# Patient Record
Sex: Female | Born: 1935 | Race: Black or African American | Hispanic: No | Smoking: Former smoker
Health system: Southern US, Community
[De-identification: ages and names within clinical notes are randomized; demographics above are authoritative.]

## PROBLEM LIST (undated history)

## (undated) DIAGNOSIS — I10 Essential (primary) hypertension: Secondary | ICD-10-CM

## (undated) DIAGNOSIS — R319 Hematuria, unspecified: Secondary | ICD-10-CM

## (undated) DIAGNOSIS — R011 Cardiac murmur, unspecified: Secondary | ICD-10-CM

## (undated) DIAGNOSIS — M1990 Unspecified osteoarthritis, unspecified site: Secondary | ICD-10-CM

## (undated) DIAGNOSIS — R300 Dysuria: Secondary | ICD-10-CM

## (undated) DIAGNOSIS — G309 Alzheimer's disease, unspecified: Secondary | ICD-10-CM

## (undated) DIAGNOSIS — I639 Cerebral infarction, unspecified: Secondary | ICD-10-CM

## (undated) DIAGNOSIS — E559 Vitamin D deficiency, unspecified: Secondary | ICD-10-CM

## (undated) DIAGNOSIS — M2550 Pain in unspecified joint: Secondary | ICD-10-CM

## (undated) DIAGNOSIS — F0280 Dementia in other diseases classified elsewhere without behavioral disturbance: Secondary | ICD-10-CM

## (undated) DIAGNOSIS — C801 Malignant (primary) neoplasm, unspecified: Secondary | ICD-10-CM

## (undated) DIAGNOSIS — N390 Urinary tract infection, site not specified: Secondary | ICD-10-CM

## (undated) DIAGNOSIS — N182 Chronic kidney disease, stage 2 (mild): Secondary | ICD-10-CM

## (undated) HISTORY — DX: Dementia in other diseases classified elsewhere without behavioral disturbance: F02.80

## (undated) HISTORY — DX: Chronic kidney disease, stage 2 (mild): N18.2

## (undated) HISTORY — DX: Alzheimer's disease, unspecified: G30.9

## (undated) HISTORY — DX: Vitamin D deficiency, unspecified: E55.9

## (undated) HISTORY — DX: Essential (primary) hypertension: I10

## (undated) HISTORY — DX: Pain in unspecified joint: M25.50

## (undated) HISTORY — DX: Hematuria, unspecified: R31.9

## (undated) HISTORY — DX: Dysuria: R30.0

## (undated) HISTORY — DX: Unspecified osteoarthritis, unspecified site: M19.90

## (undated) HISTORY — DX: Urinary tract infection, site not specified: N39.0

---

## 1998-02-07 ENCOUNTER — Other Ambulatory Visit: Admission: RE | Admit: 1998-02-07 | Discharge: 1998-02-07 | Payer: Self-pay | Admitting: Internal Medicine

## 2000-01-19 ENCOUNTER — Ambulatory Visit (HOSPITAL_COMMUNITY): Admission: RE | Admit: 2000-01-19 | Discharge: 2000-01-19 | Payer: Self-pay | Admitting: Gastroenterology

## 2000-04-05 ENCOUNTER — Encounter: Admission: RE | Admit: 2000-04-05 | Discharge: 2000-04-05 | Payer: Self-pay | Admitting: Internal Medicine

## 2000-04-05 ENCOUNTER — Encounter: Payer: Self-pay | Admitting: Internal Medicine

## 2003-08-27 ENCOUNTER — Ambulatory Visit (HOSPITAL_COMMUNITY): Admission: RE | Admit: 2003-08-27 | Discharge: 2003-08-27 | Payer: Self-pay | Admitting: Cardiology

## 2006-02-21 ENCOUNTER — Emergency Department (HOSPITAL_COMMUNITY): Admission: EM | Admit: 2006-02-21 | Discharge: 2006-02-21 | Payer: Self-pay | Admitting: Emergency Medicine

## 2006-06-03 ENCOUNTER — Encounter: Payer: Self-pay | Admitting: Vascular Surgery

## 2006-06-03 ENCOUNTER — Ambulatory Visit (HOSPITAL_COMMUNITY): Admission: RE | Admit: 2006-06-03 | Discharge: 2006-06-03 | Payer: Self-pay | Admitting: Internal Medicine

## 2010-11-23 ENCOUNTER — Encounter: Payer: Self-pay | Admitting: Cardiology

## 2011-02-17 ENCOUNTER — Encounter: Payer: Self-pay | Admitting: Internal Medicine

## 2011-03-19 NOTE — Op Note (Signed)
[place]  Patient:    [name], [name]                   MRN: [id number] Proc. Date: 01/19/00 Adm. Date:  [id number] Attending:  Charna Sierah CC:         Lind Guest. August Saucer, M.D.                           Operative Report  DATE OF BIRTH:  February 09, 1921.  REFERRING PHYSICIAN:  Eric L. August Saucer, M.D.  PROCEDURE PERFORMED:  Colonoscopy.  ENDOSCOPIST:  Anselmo Rod, M.D.  INSTRUMENT USED:  Olympus video colonoscope.  INDICATION FOR PROCEDURE:  Rectal bleeding in a 77 year old black female.  Rule out masses, polyps, hemorrhoids, etc.  PREPROCEDURE PREPARATION:  Informed consent was procured from the patient. After risks and benefits were discussed with the patient, the patient was fasted for eight hours prior to the procedure and prepped with a bottle of magnesium citrate and a gallon of NuLytely the night prior to the procedure.  PREPROCEDURE PHYSICAL:  VITAL SIGNS:  The patient had stable vital signs.  NECK:  Supple.  CHEST:  Clear to auscultation.  S1, S2 regular.  ABDOMEN:  Soft, with normal abdominal bowel sounds.  DESCRIPTION OF PROCEDURE:  The patient was placed in the left lateral decubitus  position and sedated with 55 mg of Demerol and 5 mg of Versed intravenously. Once the patient was adequately sedate and maintained on low-flow oxygen and continuous cardiac monitoring, the Olympus video colonoscope was advanced from the rectum o the cecum without difficulty.  There were a few left-sided diverticular pockets, a few internal and small external hemorrhoids, no other abnormality was seen. The patient tolerated the procedure well without complication.  IMPRESSION: 1. Small internal and external hemorrhoids. 2. Few left-sided diverticula. 3. No masses or polyps seen.  RECOMMENDATIONS: 1. The patient has been advised to increase her fluid and fiber in her diet and to    avoid constipation. 2. Nonsteroidal ______ as an  outpatient. 3. Followup as advised in the next two weeks.  I suspect the rectal bleeding is from her hemorrhoids.  Further recommendations  will be made to her on her next visit. DD:  01/19/00 TD:  01/19/00 Job: [id number]

## 2011-03-19 NOTE — Cardiovascular Report (Signed)
NAME:  [name], [name]                    ACCOUNT NO.:  [id number]   MEDICAL RECORD NO.:  [id number]                   PATIENT TYPE:  OIB   LOCATION:  2855                                 FACILITY:  [facility]   PHYSICIAN:  Mohan N. Sharyn Lull, M.D.              DATE OF BIRTH:  02/09/1921   DATE OF PROCEDURE:  08/27/2003  DATE OF DISCHARGE:                              CARDIAC CATHETERIZATION   PROCEDURE:  1. Left cardiac catheterization.  2. Selective left and right coronary angiography.  3. Left ventriculography.  4. Aortography via left groin using Judkins technique.   INDICATIONS FOR PROCEDURE:  [name] is an 77 year old black female with  a past medical history significant for hypertension, degenerative joint  disease, complaints of retrosternal chest pressure grade 4/10 radiating to  the neck and right arm off and on when under stress relieved with rest.  The  patient gives history of exertional chest pressure associated with shortness  of breath when climbing stairs associated with diaphoresis.  Denies any  nausea, vomiting.  Denies PND, orthopnea, leg swelling.  Denies palpitation,  lightheadedness, or syncope.  Denies cough, fever, chills.  Denies relation  of chest pain to breathing food.  Also complains of occasional crampy  claudication pain in the calves.  Denies any weakness, slurred speech, or  blurring of vision.   PAST MEDICAL HISTORY:  As above.   PAST SURGICAL HISTORY:  1. She had right hand surgery in the past.  2. Left knee surgery in the past.   MEDICATIONS:  1. At home she is on Nitro-Dur 0.2 mg q.1h. daily.  2. Nitrostat sublingual 0.4 mg p.r.n.  3. Toprol XL 50 mg p.o. daily.  4. Norvasc 5 mg p.o. daily.  5. Enteric-coated aspirin 325 mg p.o. daily.  6. Multivitamin one tablet daily.  7. Os-Cal one tablet daily.  8. ___________ 8 mg p.o. daily.   SOCIAL HISTORY:  She is widowed, retired.  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  No  history of smoking or alcohol abuse.   FAMILY HISTORY:  Father died of cerebral hemorrhage at the age of 68.  Mother died of MI at the age of 23.  One sister had MI at the age of 86.  One sister had COPD and heart problems.   PHYSICAL EXAMINATION:  GENERAL:  She is alert, awake, oriented x3, in no  acute distress.  VITAL SIGNS:  Blood pressure 130/80, pulse 66, regular.  HEENT:  Conjunctivae pink.  NECK:  Supple.  No JVD.  No bruit.  LUNGS:  Clear to auscultation without rhonchi or rales.  CARDIOVASCULAR:  S1, S2 was normal.  There was no S3 gallop.  ABDOMEN:  Soft.  Bowel sounds were present.  Nontender.  EXTREMITIES:  No clubbing, cyanosis, edema.   IMPRESSION:  1. New onset angina.  2. Hypertension.  3. Degenerative joint disease.  4. Claudication pain.  5. Family history  of coronary artery disease.   PLAN:  Discussed with patient regarding left catheterization, possible PTCA  and stenting, its risks, i.e., death, MI, stroke, need for emergency CABG,  risk of restenosis, local vascular complications, etc. and consented for  PCI.   PROCEDURE:  After obtaining informed consent patient was brought to the  catheterization laboratory and was placed on fluoroscopy table.  Right groin  was prepped and draped in usual fashion.  2% Xylocaine was used for local  anesthesia in right groin.  With the help of thin wall needle attempted to  access the right femoral artery without success and then 6-French arterial  sheath was placed in the left femoral artery without problem.  Next, 6-  French left Judkins catheter was advanced over the wire under fluoroscopic  guidance up to the ascending aorta where it was pulled out.  The catheter  was aspirated and connected to the manifold.  Catheter was further advanced  and engaged into left coronary ostium.  Multiple views of the left system  were taken.  Next, the catheter was disengaged and was pulled out over the  wire and was replaced with  6-French right Judkins catheter which was  advanced over the wire under fluoroscopic guidance up to the ascending  aorta.  Right JR4, JR3.5, and no-torque catheter could not be engaged into  right coronary ostium.  Then, AL1 6-French diagnostic catheter was advanced  over the wire under fluoroscopic guidance up to the ascending aorta where it  was pulled out.  The catheter was aspirated and connected to the manifold.  Catheter was further advanced and engaged into the right coronary ostium.  Multiple views of the right system were taken.  Next, the catheter was  disengaged and was pulled out over the wire and was replaced with 6-French  pigtail catheter which was advanced over the wire under fluoroscopic  guidance up to the ascending aorta where it was pulled out.  The catheter  was aspirated and connected to the manifold.  Catheter was further advanced  across the aortic valve into the LV.  LV pressures were recorded.  Next, LV  graphy was done in 30 degree RAO position.  Post angiographic pressures were  recorded from LV and then pullback pressures were recorded from aorta.  There was no significant gradient across the aortic valve.  Next, the  pigtail catheter was pulled down into the abdominal aorta.  Aortography was  done in PA position.  Next, the pigtail catheter was pulled out over the  wire.  Sheaths were aspirated and flushed.   FINDINGS:  LV showed good LV systolic function, EF of 55-60%.  There was  moderate LVH.  There was 2+ MR.  Left main was patent.  LAD was patent.  Diagonal 1 was very small which was patent.  Diagonal 2 was medium sized  which was patent.  Ramus has 20-30% proximal stenosis.  Left circumflex was  patent.  OM 1 to OM 3 were very, very small.  RCA was patent.  Aortography  showed no abdominal aortic aneurysm.  Bilateral renal arteries  were patent.  Bilateral common iliac arteries were patent.  Right external iliac and right SFA proximally was patent.  The  patient tolerated procedure  well.  There were no complications.  The patient was transferred to recovery  room in stable condition.  Eduardo Osier. Sharyn Lull, M.D.    MNH/MEDQ  D:  08/27/2003  T:  08/27/2003  Job:  [id number]   cc:   Minerva Areola L. August Saucer, M.D.  [address] [address]  [place]  [place] [address]  Fax: [phone]   Cath Lab

## 2011-03-22 ENCOUNTER — Other Ambulatory Visit: Payer: Self-pay | Admitting: Internal Medicine

## 2011-03-22 ENCOUNTER — Ambulatory Visit
Admission: RE | Admit: 2011-03-22 | Discharge: 2011-03-22 | Disposition: A | Discharge status: Home / Self Care | Payer: Medicare Other | Source: Ambulatory Visit | Attending: Internal Medicine | Admitting: Internal Medicine

## 2011-03-22 DIAGNOSIS — R05 Cough: Secondary | ICD-10-CM

## 2011-04-01 ENCOUNTER — Encounter (INDEPENDENT_AMBULATORY_CARE_PROVIDER_SITE_OTHER): Payer: Medicare Other

## 2011-04-01 DIAGNOSIS — M79609 Pain in unspecified limb: Secondary | ICD-10-CM

## 2011-04-05 NOTE — Procedures (Unsigned)
DUPLEX DEEP VENOUS EXAM - LOWER EXTREMITY  INDICATION:  Edema and pain.  HISTORY:  Edema:  Yes. Trauma/Surgery:  No. Pain:  Yes. PE:  No. Previous DVT:  No. Anticoagulants:  Aspirin 81 mg. Other:  DUPLEX EXAM:               CFV   SFV   PopV  PTV    GSV               R  L  R  L  R  L  R   L  R  L Thrombosis    o  o     o     o      o     o Spontaneous   +  +     +     +      +     + Phasic        +  +     +     +      +     + Augmentation  +  +     +     +      +     + Compressible  +  +     +     +      +     + Competent     +  +     +     +      +     +  Legend:  + - yes  o - no  p - partial  D - decreased  IMPRESSION:  No evidence of acute deep venous thrombosis or superficial thrombophlebitis within the left lower extremity.  No evidence of venous insufficiency visualized.   _____________________________ Quita Skye Hart [place], M.D.  OD/MEDQ  D:  04/01/2011  T:  04/01/2011  Job:  [id number]

## 2011-04-20 ENCOUNTER — Inpatient Hospital Stay (HOSPITAL_COMMUNITY)
Admission: EM | Admit: 2011-04-20 | Discharge: 2011-04-22 | DRG: 312 | Disposition: A | Discharge status: Home / Self Care | Payer: Medicare Other | Attending: Internal Medicine | Admitting: Internal Medicine

## 2011-04-20 ENCOUNTER — Emergency Department (HOSPITAL_COMMUNITY): Payer: Medicare Other

## 2011-04-20 DIAGNOSIS — E860 Dehydration: Secondary | ICD-10-CM | POA: Diagnosis present

## 2011-04-20 DIAGNOSIS — N179 Acute kidney failure, unspecified: Secondary | ICD-10-CM | POA: Diagnosis present

## 2011-04-20 DIAGNOSIS — F0390 Unspecified dementia without behavioral disturbance: Secondary | ICD-10-CM | POA: Diagnosis present

## 2011-04-20 DIAGNOSIS — I2510 Atherosclerotic heart disease of native coronary artery without angina pectoris: Secondary | ICD-10-CM | POA: Diagnosis present

## 2011-04-20 DIAGNOSIS — R55 Syncope and collapse: Principal | ICD-10-CM | POA: Diagnosis present

## 2011-04-20 DIAGNOSIS — I10 Essential (primary) hypertension: Secondary | ICD-10-CM | POA: Diagnosis present

## 2011-04-20 DIAGNOSIS — E876 Hypokalemia: Secondary | ICD-10-CM | POA: Diagnosis present

## 2011-04-20 LAB — CK TOTAL AND CKMB (NOT AT ARMC)
CK, MB: 2.8 ng/mL (ref 0.3–4.0)
CK, MB: 4.7 ng/mL — ABNORMAL HIGH (ref 0.3–4.0)
Relative Index: INVALID (ref 0.0–2.5)
Total CK: 65 U/L (ref 7–177)
Total CK: 97 U/L (ref 7–177)

## 2011-04-20 LAB — DIFFERENTIAL
Basophils Absolute: 0 10*3/uL (ref 0.0–0.1)
Eosinophils Absolute: 0 10*3/uL (ref 0.0–0.7)
Lymphs Abs: 1.7 10*3/uL (ref 0.7–4.0)
Neutrophils Relative %: 53 % (ref 43–77)

## 2011-04-20 LAB — URINALYSIS, ROUTINE W REFLEX MICROSCOPIC
Glucose, UA: NEGATIVE mg/dL
Hgb urine dipstick: NEGATIVE
Leukocytes, UA: NEGATIVE
Specific Gravity, Urine: 1.017 (ref 1.005–1.030)

## 2011-04-20 LAB — COMPREHENSIVE METABOLIC PANEL
AST: 27 U/L (ref 0–37)
Albumin: 3.7 g/dL (ref 3.5–5.2)
Calcium: 11.5 mg/dL — ABNORMAL HIGH (ref 8.4–10.5)
Chloride: 102 mEq/L (ref 96–112)
Creatinine, Ser: 1.4 mg/dL — ABNORMAL HIGH (ref 0.50–1.10)
Total Protein: 7.1 g/dL (ref 6.0–8.3)

## 2011-04-20 LAB — CBC
MCV: 78.3 fL (ref 78.0–100.0)
Platelets: 209 10*3/uL (ref 150–400)
RBC: 5.17 MIL/uL — ABNORMAL HIGH (ref 3.87–5.11)
WBC: 4.5 10*3/uL (ref 4.0–10.5)

## 2011-04-20 LAB — TROPONIN I
Troponin I: <0.3 ng/mL (ref <0.30)
Troponin I: <0.3 ng/mL (ref <0.30)

## 2011-04-21 DIAGNOSIS — R55 Syncope and collapse: Secondary | ICD-10-CM

## 2011-04-21 LAB — CBC
Hemoglobin: 13.6 g/dL (ref 12.0–15.0)
MCH: 26.8 pg (ref 26.0–34.0)
MCHC: 33.9 g/dL (ref 30.0–36.0)
MCV: 78.9 fL (ref 78.0–100.0)
RBC: 5.08 MIL/uL (ref 3.87–5.11)

## 2011-04-21 LAB — BASIC METABOLIC PANEL
BUN: 25 mg/dL — ABNORMAL HIGH (ref 6–23)
CO2: 25 mEq/L (ref 19–32)
CO2: 28 mEq/L (ref 19–32)
Calcium: 10.6 mg/dL — ABNORMAL HIGH (ref 8.4–10.5)
Calcium: 11 mg/dL — ABNORMAL HIGH (ref 8.4–10.5)
Chloride: 104 mEq/L (ref 96–112)
Creatinine, Ser: 1.22 mg/dL — ABNORMAL HIGH (ref 0.50–1.10)
Creatinine, Ser: 1.26 mg/dL — ABNORMAL HIGH (ref 0.50–1.10)
GFR calc Af Amer: 48 mL/min — ABNORMAL LOW (ref >60)
GFR calc non Af Amer: 42 mL/min — ABNORMAL LOW (ref >60)
GFR calc non Af Amer: 40 mL/min — ABNORMAL LOW (ref >60)
Glucose, Bld: 92 mg/dL (ref 70–99)
Glucose, Bld: 92 mg/dL (ref 70–99)
Potassium: 3.7 mEq/L (ref 3.5–5.1)
Sodium: 140 mEq/L (ref 135–145)
Sodium: 141 mEq/L (ref 135–145)

## 2011-04-21 LAB — CARDIAC PANEL(CRET KIN+CKTOT+MB+TROPI)
CK, MB: 4.4 ng/mL — ABNORMAL HIGH (ref 0.3–4.0)
CK, MB: 4.5 ng/mL — ABNORMAL HIGH (ref 0.3–4.0)
Relative Index: INVALID (ref 0.0–2.5)
Relative Index: 4.5 — ABNORMAL HIGH (ref 0.0–2.5)
Total CK: 95 U/L (ref 7–177)
Total CK: 100 U/L (ref 7–177)
Troponin I: <0.3 ng/mL (ref <0.30)
Troponin I: <0.3 ng/mL (ref <0.30)

## 2011-04-21 LAB — LIPID PANEL
Cholesterol: 171 mg/dL (ref 0–200)
HDL: 82 mg/dL (ref >39)
LDL Cholesterol: 81 mg/dL (ref 0–99)
Total CHOL/HDL Ratio: 2.1 RATIO
Triglycerides: 42 mg/dL (ref <150)
VLDL: 8 mg/dL (ref 0–40)

## 2011-04-21 LAB — URINE CULTURE
Colony Count: 90000
Culture  Setup Time: 201206192334

## 2011-04-21 LAB — HEMOGLOBIN A1C: Hgb A1c MFr Bld: 5.7 % — ABNORMAL HIGH (ref <5.7)

## 2011-04-22 LAB — COMPREHENSIVE METABOLIC PANEL
ALT: 16 U/L (ref 0–35)
Albumin: 3 g/dL — ABNORMAL LOW (ref 3.5–5.2)
BUN: 18 mg/dL (ref 6–23)
Calcium: 10 mg/dL (ref 8.4–10.5)
GFR calc Af Amer: >60 mL/min (ref >60)
Glucose, Bld: 87 mg/dL (ref 70–99)
Sodium: 140 mEq/L (ref 135–145)
Total Protein: 6 g/dL (ref 6.0–8.3)

## 2011-04-22 LAB — CBC
Hemoglobin: 12.2 g/dL (ref 12.0–15.0)
MCH: 26.8 pg (ref 26.0–34.0)
MCHC: 34.3 g/dL (ref 30.0–36.0)
RDW: 13.9 % (ref 11.5–15.5)

## 2011-04-23 LAB — PROTEIN ELECTROPH W RFLX QUANT IMMUNOGLOBULINS
Alpha-1-Globulin: 4.2 % (ref 2.9–4.9)
Beta 2: 6.3 % (ref 3.2–6.5)

## 2011-04-23 LAB — VITAMIN D 1,25 DIHYDROXY
Vitamin D 1, 25 (OH)2 Total: 48 pg/mL (ref 18–72)
Vitamin D2 1, 25 (OH)2: <8 pg/mL

## 2011-04-23 NOTE — Discharge Summary (Signed)
[name], [name] NO.:  [id number]  MEDICAL RECORD NO.:  [id number]  LOCATION:  2029                         FACILITY:  [facility]  PHYSICIAN:  Thad Ranger, MD       DATE OF BIRTH:  02/07/1921  DATE OF ADMISSION:  04/20/2011 DATE OF DISCHARGE:                        DISCHARGE SUMMARY - REFERRING   PRIMARY CARE PHYSICIAN:  Eric L. August Saucer, M.D.  DISCHARGE DIAGNOSES: 1. Syncope, likely vasovagal versus dehydration. 2. Acute kidney injury with a creatinine of 1.4, likely due to     hypovolemia. 3. Hypercalcemia, likely secondary to calcium supplementation with     hydrochlorothiazide and hypovolemia, improved. 4. Hypertension. 5. Dementia. 6. Generalized debility. 7. Dehydration.  DISCHARGE MEDICATIONS: 1. Amlodipine 5 mg p.o. daily. 2. Donepezil 5 mg p.o. daily. 3. Meloxicam 7.5 mg p.o. daily. 4. Multivitamin 1 tablet p.o. daily.  The patient was counseled to stop taking the following medications which includes triamterene/hydrochlorothiazide.  BRIEF HISTORY OF PRESENT ILLNESS AT THE TIME OF ADMISSION:  [name] is an 77 year old female with past medical history of coronary artery disease and hypertension, who presented to the emergency room with syncopal episode.  The patient's son at the time of admission noted that the patient was not responding in the afternoon of admission and thereafter she had a fall.  The patient lost consciousness for about 2-3 minutes.  EMS was called.  History was taken from the patient, the patient's son, and the records.  The patient was apparently working in the yard before the fall.  She had been complaining of diarrhea for the past few days, but no nausea, vomiting, abdominal pain, or shortness of breath.  The patient had complained of chest pain, substernal, nonradiating, which lasted for a few minutes.  RADIOLOGICAL DATA:  Chest x-ray, two-view on June 19; no active disease. No significant change.  Chest CT head  without contrast on June 19; atrophia with chronic microvascular ischemia.  No acute abnormality.  2- D echo in June 20 showed EF of 65-70%, normal wall motion, no recent wall motion abnormalities, grade 1 diastolic dysfunction.  Carotid Doppler on June 20; preliminary findings showed no ICA stenosis bilaterally.  PERTINENT LABORATORY AND DIAGNOSTIC DATA:  CBC at the time of admission; white count 4.5, hemoglobin 13.9, hematocrit 40.5, platelets 209, troponin less than 0.3.  UA negative for any UTI.  HbA1c 5.7.  TSH 1.15. Calcium was elevated at 11.5 with ionized calcium at 1.43.  Vitamin D level was within normal range.  Lipid profile; cholesterol 171, LDL 81.  BRIEF HOSPITALIZATION COURSE:  [name] is an 77 year old female who was admitted with a syncopal episode, likely vasovagal with dehydration, diarrhea, and acute kidney injury. 1. Syncope, likely vasovagal.  The patient was apparently working in     the yard on the day of admission, and the temperature outside was     90 degrees.  The patient also had been complaining of diarrhea     prior to the admission.  She was aggressively hydrated with IV     fluids.  Creatinine was noted to be 1.4.  Triamterene and     hydrochlorothiazide was discontinued from the patient's profile.     Calcium was also  elevated at 11.5.  Creatinine function has     improved to 0.9 at the time of discharge.  2-D echo and carotid     Dopplers were done which were essentially unremarkable.  Details as     dictated above.  CT head was done which was negative for any acute     stroke. 2. Acute kidney injury, prerenal, likely secondary to hypokalemia,     dehydration, diarrhea, and being on triamterene and     hydrochlorothiazide.  She was gently hydrated with IV fluids.  At     the time of discharge, creatinine is 0.9. 3. Hypercalcemia, most likely due to calcium supplementation with     hydrochlorothiazide with hypovolemia.  The patient was gently      hydrated with IV fluids.  She did not require any pamidronate or     bisglycinate.  Calcium is 10 at the time of discharge.  The     patient's son also reported that she was drinking milk of magnesia     at home quite frequently.  The patient and her son was counseled     against the overuse of milk of magnesium. 4. Hypertension.  Blood pressure remained stable during the     hospitalization.  The patient was started on low dose of     amlodipine, triamterene, and hydrochlorothiazide but discontinued     from her profile.  PHYSICAL EXAMINATION AT THE TIME OF DISCHARGE:  VITAL SIGNS: Temperature 98.0, pulse 67, respirations 18, blood pressure 120/59, O2 sats 100% on room air. GENERAL:  The patient is alert, awake, and oriented, currently at baseline mental status. CVS:  S1 and S2 clear. CHEST:  Clear to auscultation bilaterally. ABDOMEN:  Soft, nontender, nondistended.  Normal bowel sounds. EXTREMITIES:  No cyanosis, clubbing or edema noted in upper or lower extremities bilaterally.  The patient is ambulating in the room without any assistance.  DISPOSITION:  Physical therapy was also consulted for evaluation. Patient has all the assistive devices at home and per PT/OT, she currently appears to be at baseline and needs 24x7 supervision at home. I discussed in detail with the patient's son, Jillyn Hidden, who she lives with at home and the family will be arranging home health aide for 24x7 supervision at home.  DISCHARGE FOLLOWUP:  With Dr. Willey Blade within next 7-10 days.  Discharge time 35 minutes.     Thad Ranger, MD    RR/MEDQ  D:  04/22/2011  T:  04/22/2011  Job:  [id number]  cc:   Minerva Areola L. August Saucer, M.D.  Electronically Signed by Andres Labrum RAI  on 04/23/2011 05:29:38 PM

## 2011-04-27 LAB — CULTURE, BLOOD (ROUTINE X 2): Culture: NO GROWTH

## 2011-05-01 NOTE — H&P (Signed)
NAMEJOYCELIN, RADLOFF NO.:  [id number]  MEDICAL RECORD NO.:  [id number]  LOCATION:  2029                         FACILITY:  [facility]  PHYSICIAN:  Celso Amy, MD   DATE OF BIRTH:  02/07/1921  DATE OF ADMISSION:  04/20/2011 DATE OF DISCHARGE:                             HISTORY & PHYSICAL   CHIEF COMPLAINT:  Passing out.  HISTORY OF PRESENT ILLNESS:  The patient is an 77 year old African American female with a past medical history of coronary artery disease who presented to ER with chief complaint of syncope.  History of present illness dates back to this afternoon when the patient's son noticed that the patient was not responding and thereafter she had a fall.  The patient lost consciousness for 2-3 minutes, 9-1-1 was called and after the fall, the patient was distended for 1-2 minutes and later she started recovering.  At the time of H and P, the patient is at her baseline.  History has been taken from the patient's, patient's son and records.  The patient was working in the yard before the fall.  The patient was complaining of diarrhea from past few days.  No complaint of nausea or vomiting.  No complaint of abdominal pain.  No complaint of shortness of breath.  The patient complained of chest pain substernal non radiating which lasted for few minutes.  The patient is not able to describe any exacerbating or relieving factors.  No complaint of slurred speech or any focal weakness.  No complaint of fever, cough or chills. No complaint of any overt bleeding.  No complaint of recent travel.  ALLERGIES:  The patient has no known drug allergies.  FAMILY HISTORY:  The patient says she cannot remember how her parents passed away.  When asked from the son, the patient says her mother passed away from stroke and he does not remember how her father passed away.  SOCIAL HISTORY:  The patient lives with her son.  The patient is a nonsmoker and nondrinker.  PAST  MEDICAL HISTORY:  Positive for dementia, coronary artery disease, hypertension, osteoporosis and osteoarthritis.  REVIEW OF SYSTEMS:  Negative.  MEDICATIONS AS OUTPATIENT: 1. The patient is on donepezil 5 mg p.o. daily. 2. Calcium, magnesium and zinc tablet 1 tablet p.o. daily. 3. Multivitamin 1 tablet p.o. daily. 4. Vitamin B12 500 mg p.o. daily. 5. Meloxicam 7.5 mg p.o. daily. 6. Triamterene/hydrochlorothiazide 27.5/25 mg p.o. daily.  PHYSICAL EXAMINATION:  VITALS:  Blood pressure is 124/51, pulse 76, respiratory rate is 16, temperature afebrile, pulse ox the patient is 100%. GENERAL:  The patient is awake, alert and oriented to place and person. He is well built. HEENT:  Pupils are equally reactive to light and accommodation. Extraocular movement is intact.  Head is atraumatic, normocephalic. NECK:  Supple. RESPIRATORY:  No acute respiratory distress. CHEST:  Clear to auscultation bilaterally.  No rhonchi or rales were heard. CARDIOVASCULAR:  S1 and S2.  Regular in rate and rhythm.  No murmur is appreciated. GI:  Deep bowel sounds are present. ABDOMEN:  Soft, nontender and nondistended. EXTREMITIES:  No lower extremity or cyanosis was seen. CNS:  Cranial nerves II through XII were grossly intact.  No focal motor deficit  was seen. PSYCH:  The patient is in normal mood and affect.  LABORATORY DATA:  Sodium 139, potassium 4.2, serum chloride 102, bicarb 23, BUN 29, serum creatinine 1.4, glucose 73.  The patient's hemoglobin is 13.9, platelets 209, troponin's less than 0.30, CK 65, CK-MB 2.8. EKG shows normal sinus rhythm, calcium is 11.5.  UA shows ketones positive.  Leukocyte esterase and nitrates are negative.  The patient's CT head shows atrophy and chronic microvascular ischemic disease.  No acute abnormality.  Chest x-ray shows no acute disease.  IMPRESSION: 1. Syncope.  The patient is an 77 year old African American female     with dementia, was working in yard and  today's temperature was 90     degrees and the patient is presenting with passing out.  This     syncope could be cardiac versus volume related versus vasovagal. 2. Renal acute kidney injury.  The patient's creatinine 1.4.  This is     most likely because of prerenal and is most likely because of     hypovolemia. 3. Fluid electrolyte nutrition.  The patient is normocalcemic and     normonatremic.  The patient is hypercalcemic most likely because of     calcium supplements plus hydrochlorothiazide plus hypovolemia. 4. Cardiovascular system.  The patient is hemodynamically stable.     Blood pressure is at goal and there is no need of any pressor. 5. Deep venous thrombosis.  We will keep the patient on DVT     prophylaxis. 6. Central nervous system.  Dementia, stable.  PLAN: 1. We will admit the patient to tele. 2. We will cycle troponin's. 3. We will start the patient on IV fluids. 4. We will hold calcium supplements. 5. We will hold her antihypertensive medication. 6. We will follow the patient's BMET to see the trend of calcium.  In     case, the calcium is not back to baseline, then will need PTH     intact, PTHRP, SPEP, UPEP, vitamin D 25, vitamin D 125. 7. If the creatinine is not back to baseline, we will need Nephrology. 8. We will need to get the patient's records from her primary care. 9. The patient's further clinical course depends how the patient does     with treatment plan.    Celso Amy, MD    MB/MEDQ  D:  04/20/2011  T:  04/21/2011  Job:  [id number]  Electronically Signed by Celso Amy M.D. on 05/01/2011 11:35:54 AM

## 2011-06-08 ENCOUNTER — Other Ambulatory Visit: Payer: Self-pay | Admitting: Internal Medicine

## 2011-06-08 DIAGNOSIS — R519 Headache, unspecified: Secondary | ICD-10-CM

## 2011-06-09 ENCOUNTER — Other Ambulatory Visit: Payer: Medicare Other

## 2011-06-11 ENCOUNTER — Ambulatory Visit
Admission: RE | Admit: 2011-06-11 | Discharge: 2011-06-11 | Disposition: A | Discharge status: Home / Self Care | Payer: Medicare Other | Source: Ambulatory Visit | Attending: Internal Medicine | Admitting: Internal Medicine

## 2011-08-09 ENCOUNTER — Emergency Department (HOSPITAL_COMMUNITY)
Admission: EM | Admit: 2011-08-09 | Discharge: 2011-08-09 | Disposition: A | Discharge status: Home / Self Care | Payer: Medicare Other | Attending: Emergency Medicine | Admitting: Emergency Medicine

## 2011-08-09 DIAGNOSIS — I10 Essential (primary) hypertension: Secondary | ICD-10-CM | POA: Insufficient documentation

## 2011-08-09 DIAGNOSIS — N390 Urinary tract infection, site not specified: Secondary | ICD-10-CM | POA: Insufficient documentation

## 2011-08-09 DIAGNOSIS — R000 Tachycardia, unspecified: Secondary | ICD-10-CM | POA: Insufficient documentation

## 2011-08-09 DIAGNOSIS — R5381 Other malaise: Secondary | ICD-10-CM | POA: Insufficient documentation

## 2011-08-09 DIAGNOSIS — G309 Alzheimer's disease, unspecified: Secondary | ICD-10-CM | POA: Insufficient documentation

## 2011-08-09 DIAGNOSIS — F0280 Dementia in other diseases classified elsewhere without behavioral disturbance: Secondary | ICD-10-CM | POA: Insufficient documentation

## 2011-08-09 LAB — URINALYSIS, ROUTINE W REFLEX MICROSCOPIC
Hgb urine dipstick: NEGATIVE
Protein, ur: NEGATIVE mg/dL
Urobilinogen, UA: 1 mg/dL (ref 0.0–1.0)

## 2011-08-09 LAB — DIFFERENTIAL
Basophils Absolute: 0 10*3/uL (ref 0.0–0.1)
Eosinophils Absolute: 0 10*3/uL (ref 0.0–0.7)
Eosinophils Relative: 0 % (ref 0–5)
Lymphocytes Relative: 7 % — ABNORMAL LOW (ref 12–46)
Monocytes Absolute: 0.8 10*3/uL (ref 0.1–1.0)

## 2011-08-09 LAB — CBC
HCT: 43.7 % (ref 36.0–46.0)
MCHC: 34.8 g/dL (ref 30.0–36.0)
MCV: 76.7 fL — ABNORMAL LOW (ref 78.0–100.0)
Platelets: 403 10*3/uL — ABNORMAL HIGH (ref 150–400)
RDW: 14.5 % (ref 11.5–15.5)

## 2011-08-13 ENCOUNTER — Emergency Department (HOSPITAL_COMMUNITY): Payer: Medicare Other

## 2011-08-13 ENCOUNTER — Inpatient Hospital Stay (HOSPITAL_COMMUNITY)
Admission: EM | Admit: 2011-08-13 | Discharge: 2011-08-20 | DRG: 690 | Disposition: A | Discharge status: Home / Self Care | Payer: Medicare Other | Attending: Internal Medicine | Admitting: Internal Medicine

## 2011-08-13 DIAGNOSIS — K5641 Fecal impaction: Secondary | ICD-10-CM | POA: Diagnosis present

## 2011-08-13 DIAGNOSIS — F068 Other specified mental disorders due to known physiological condition: Secondary | ICD-10-CM | POA: Diagnosis present

## 2011-08-13 DIAGNOSIS — M1990 Unspecified osteoarthritis, unspecified site: Secondary | ICD-10-CM | POA: Diagnosis present

## 2011-08-13 DIAGNOSIS — I10 Essential (primary) hypertension: Secondary | ICD-10-CM | POA: Diagnosis present

## 2011-08-13 DIAGNOSIS — R109 Unspecified abdominal pain: Secondary | ICD-10-CM | POA: Diagnosis present

## 2011-08-13 DIAGNOSIS — E871 Hypo-osmolality and hyponatremia: Secondary | ICD-10-CM | POA: Diagnosis present

## 2011-08-13 DIAGNOSIS — N179 Acute kidney failure, unspecified: Secondary | ICD-10-CM | POA: Diagnosis present

## 2011-08-13 DIAGNOSIS — E876 Hypokalemia: Secondary | ICD-10-CM | POA: Diagnosis present

## 2011-08-13 DIAGNOSIS — N390 Urinary tract infection, site not specified: Principal | ICD-10-CM | POA: Diagnosis present

## 2011-08-13 LAB — CBC
HCT: 43.6 % (ref 36.0–46.0)
Hemoglobin: 15.1 g/dL — ABNORMAL HIGH (ref 12.0–15.0)
RBC: 5.7 MIL/uL — ABNORMAL HIGH (ref 3.87–5.11)
RDW: 14.6 % (ref 11.5–15.5)
WBC: 12.3 10*3/uL — ABNORMAL HIGH (ref 4.0–10.5)

## 2011-08-13 LAB — DIFFERENTIAL
Basophils Absolute: 0 10*3/uL (ref 0.0–0.1)
Lymphocytes Relative: 10 % — ABNORMAL LOW (ref 12–46)
Neutro Abs: 10 10*3/uL — ABNORMAL HIGH (ref 1.7–7.7)
Neutrophils Relative %: 81 % — ABNORMAL HIGH (ref 43–77)

## 2011-08-13 LAB — URINALYSIS, ROUTINE W REFLEX MICROSCOPIC
Nitrite: NEGATIVE
Specific Gravity, Urine: 1.025 (ref 1.005–1.030)
pH: 5.5 (ref 5.0–8.0)

## 2011-08-13 LAB — COMPREHENSIVE METABOLIC PANEL
Albumin: 2.3 g/dL — ABNORMAL LOW (ref 3.5–5.2)
Alkaline Phosphatase: 141 U/L — ABNORMAL HIGH (ref 39–117)
BUN: 48 mg/dL — ABNORMAL HIGH (ref 6–23)
Potassium: 4 mEq/L (ref 3.5–5.1)
Sodium: 134 mEq/L — ABNORMAL LOW (ref 135–145)
Total Protein: 8.1 g/dL (ref 6.0–8.3)

## 2011-08-13 LAB — POCT I-STAT TROPONIN I: Troponin i, poc: 0.02 ng/mL (ref 0.00–0.08)

## 2011-08-13 LAB — URINE MICROSCOPIC-ADD ON

## 2011-08-13 LAB — CK TOTAL AND CKMB (NOT AT ARMC)
CK, MB: 1.3 ng/mL (ref 0.3–4.0)
Total CK: 117 U/L (ref 7–177)

## 2011-08-13 LAB — PROCALCITONIN: Procalcitonin: 1.06 ng/mL

## 2011-08-13 MED ORDER — IOHEXOL 300 MG/ML  SOLN
75.0000 mL | Freq: Once | INTRAMUSCULAR | Status: AC | PRN
  Administered 2011-08-13: 75 mL via INTRAVENOUS

## 2011-08-14 ENCOUNTER — Inpatient Hospital Stay (HOSPITAL_COMMUNITY): Payer: Medicare Other

## 2011-08-14 LAB — COMPREHENSIVE METABOLIC PANEL
AST: 34 U/L (ref 0–37)
Albumin: 1.9 g/dL — ABNORMAL LOW (ref 3.5–5.2)
Calcium: 11.1 mg/dL — ABNORMAL HIGH (ref 8.4–10.5)
Chloride: 96 mEq/L (ref 96–112)
Creatinine, Ser: 0.92 mg/dL (ref 0.50–1.10)
Total Protein: 6.3 g/dL (ref 6.0–8.3)

## 2011-08-14 LAB — TYPE AND SCREEN: ABO/RH(D): B POS

## 2011-08-14 LAB — DIFFERENTIAL
Lymphocytes Relative: 13 % (ref 12–46)
Monocytes Absolute: 0.7 10*3/uL (ref 0.1–1.0)
Monocytes Relative: 7 % (ref 3–12)
Neutro Abs: 7.4 10*3/uL (ref 1.7–7.7)

## 2011-08-14 LAB — LIPASE, BLOOD: Lipase: 12 U/L (ref 11–59)

## 2011-08-14 LAB — CBC
Hemoglobin: 15.2 g/dL — ABNORMAL HIGH (ref 12.0–15.0)
MCV: 76.6 fL — ABNORMAL LOW (ref 78.0–100.0)
Platelets: 309 10*3/uL (ref 150–400)
RBC: 5.86 MIL/uL — ABNORMAL HIGH (ref 3.87–5.11)
WBC: 9.3 10*3/uL (ref 4.0–10.5)

## 2011-08-14 LAB — ABO/RH: ABO/RH(D): B POS

## 2011-08-14 NOTE — H&P (Signed)
NAME:  [name], NAY NO.:  [id number]  MEDICAL RECORD NO.:  [id number]  LOCATION:  WLED                         FACILITY:  [facility]  PHYSICIAN:  Hillery Aldo, M.D.   DATE OF BIRTH:  1922/07/29  DATE OF ADMISSION:  08/13/2011 DATE OF DISCHARGE:                             HISTORY & PHYSICAL   PRIMARY CARE PHYSICIAN:  Eric L. August Saucer, M.D.  CHIEF COMPLAINT:  Weakness, diarrhea.  HISTORY OF PRESENT ILLNESS:  The patient is an 77 year old female with past medical history of dementia, who was brought in by her family today with a chief complaint of weakness and diarrhea.  She apparently was seen in the emergency department on August 09, 2011, and diagnosed with a urinary tract infection, and sent home on antibiotics.  Her family brought her to her PCP today and he advised them to bring her to the emergency department.  No family is currently present and I am unable to reach any of them by telephone to obtain any additional history.  The patient is unable to provide me with any details of her presenting complaints and states she actually feels fine.  She does have advanced dementia and is unable to tell me if she has been having loose stools or how she feels in general.  PAST MEDICAL HISTORY: 1. Syncope. 2. Hypertension. 3. Dementia. 4. Degenerative joint disease. 5. Status post cardiac catheterization in October 2004 showing an     ejection fraction of 55% to 60% and minimal coronary disease. 6. Hemorrhoids and diverticulosis noted on colonoscopy performed in     2001 by Dr. Charna Courtny.  FAMILY HISTORY:  Gleaned from her records.  Her father apparently died at age 72 from a cerebral hemorrhage.  The patient's mother died of an MI at age 40.  She also has a sister who had an MI at age 74 and another sister with COPD and heart disease.  SOCIAL HISTORY:  Again, gleaned from her old records as she cannot provide any history to me.  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 There is no history of tobacco, alcohol, or drug use.  ALLERGIES:  No known drug allergies.  CURRENT MEDICATIONS: 1. Multivitamin 1 tablet p.o. daily. 2. Meloxicam 7.5 mg p.o. daily. 3. Donepezil 5 mg p.o. daily. 4. Amlodipine 5 mg p.o. daily.  REVIEW OF SYSTEMS:  Unable to obtain in this demented patient who cannot verbalize to me.  PHYSICAL EXAMINATION:  VITAL SIGNS:  Temperature 97.6, pulse 99, respirations 19, blood pressure 124/72, O2 saturation 96% on room air. GENERAL:  Frail elderly African American female, in no acute distress. HEENT:  Normocephalic, atraumatic.  PERRL.  EOMI.  Oropharynx is clear. Mucous membranes are slightly dry. NECK:  Supple, no thyromegaly, no lymphadenopathy, no jugular venous distention. CHEST:  Diminished breath sounds at the bases.  No rhonchi or rales. HEART:  Mildly tachycardic with a grade 2/6 systolic ejection murmur at the left upper sternal border. ABDOMEN:  Soft, nontender, nondistended with normoactive bowel sounds. EXTREMITIES:  No clubbing, edema, or cyanosis. SKIN:  Warm and dry.  No rashes. NEUROLOGIC:  The patient is disoriented and minimally verbal.  She is not cooperative with neurological exam.  DATA REVIEW:  Chest x-ray shows no active cardiopulmonary disease.  CT scan of the abdomen and pelvis shows fecal impaction of the rectum with no evidence of bowel obstruction or perforation.  Mild biliary and pancreatic ductal dilatation with no evidence of pancreatic mass or surrounding inflammation.  Multiple renal cysts.  12-lead EKG shows sinus tachycardia with no T-wave abnormalities.  LABORATORY DATA:  Urinalysis yields cloudy urine, negative for nitrites with microscopy showing 7-10 white blood cells and many bacteria.  CK is 117, CK-MB 1.3.  Procalcitonin is 1.06.  Sodium is 134, potassium 4.0, chloride 95, bicarb 26, BUN 48, creatinine 1.34, glucose 148, total bilirubin 0.3, alkaline phosphatase 141, AST  49, ALT 29, total protein 8.1, albumin 2.3, calcium 12.  Venous lactic acid is 2.3.  White blood cell count is 12.3, hemoglobin 15.1, hematocrit 43.6, platelets 361.  ASSESSMENT AND PLAN: 1. Urinary tract infection:  The patient has been given a dose of     Rocephin in the emergency department.  We will check urine cultures     and continue Rocephin empirically until culture data is back. 2. Acute renal failure:  The patient's baseline creatinine back in     June is 0.99.  We will hydrate her and monitor her renal function     closely.  We will hold her meloxicam. 3. Hyponatremia:  Likely due to dehydration.  We will rehydrate and     monitor her electrolytes closely. 4. Fecal impaction:  Likely exacerbated by hypercalcemia.  We will     have the nursing staff disimpact her and put her on a very     aggressive bowel regimen. 5. Hypercalcemia:  Unclear etiology, but she has had this in the past.     Serum protein electrophoresis was performed back in June as well as     vitamin D studies and PTH studies.  These were all unrevealing and     ultimately her hypercalcemia was felt to be secondary to     dehydration in the setting of treatment with hydrochlorothiazide.     Apparently, her calcium normalized with hydration.  At this point,     we would start her on nasal calcitonin and consider reevaluating     her if she does not normalize quickly. 6. Dementia:  The patient does have advanced dementia and we will     continue her donepezil. 7. History of hypertension:  Continue the patient's amlodipine with     parameters to hold if her systolic blood     pressure is less than 105. 8. Prophylaxis:  Initiate PAS hoses for deep venous thrombosis     prophylaxis.  Time spent on admission including face-to-face time equals approximately 1 hour.     Hillery Aldo, M.D.     CR/MEDQ  D:  08/13/2011  T:  08/14/2011  Job:  [id number]  cc:   Minerva Areola L. August Saucer, M.D. [address]  [address] [place] [address]  Electronically Signed by Hillery Aldo M.D. on 08/14/2011 12:29:06 PM

## 2011-08-16 LAB — DIFFERENTIAL
Basophils Absolute: 0 10*3/uL (ref 0.0–0.1)
Lymphocytes Relative: 16 % (ref 12–46)
Neutro Abs: 5.8 10*3/uL (ref 1.7–7.7)
Neutrophils Relative %: 73 % (ref 43–77)

## 2011-08-16 LAB — PTH, INTACT AND CALCIUM: Calcium, Total (PTH): 9.7 mg/dL (ref 8.4–10.5)

## 2011-08-16 LAB — CBC
HCT: 48.1 % — ABNORMAL HIGH (ref 36.0–46.0)
Hemoglobin: 16.1 g/dL — ABNORMAL HIGH (ref 12.0–15.0)
RBC: 6.25 MIL/uL — ABNORMAL HIGH (ref 3.87–5.11)
RDW: 15.1 % (ref 11.5–15.5)
WBC: 7.9 10*3/uL (ref 4.0–10.5)

## 2011-08-16 LAB — COMPREHENSIVE METABOLIC PANEL
CO2: 25 mEq/L (ref 19–32)
Calcium: 10.4 mg/dL (ref 8.4–10.5)
Creatinine, Ser: 0.64 mg/dL (ref 0.50–1.10)
GFR calc Af Amer: >90 mL/min (ref >90)
GFR calc non Af Amer: 78 mL/min — ABNORMAL LOW (ref >90)
Glucose, Bld: 69 mg/dL — ABNORMAL LOW (ref 70–99)

## 2011-08-17 ENCOUNTER — Inpatient Hospital Stay (HOSPITAL_COMMUNITY): Payer: Medicare Other

## 2011-08-17 LAB — CBC
HCT: 38.1 % (ref 36.0–46.0)
RDW: 15.1 % (ref 11.5–15.5)
WBC: 6.7 10*3/uL (ref 4.0–10.5)

## 2011-08-17 LAB — BASIC METABOLIC PANEL
BUN: 16 mg/dL (ref 6–23)
Chloride: 108 mEq/L (ref 96–112)
GFR calc Af Amer: >90 mL/min (ref >90)
Potassium: 4.4 mEq/L (ref 3.5–5.1)

## 2011-08-17 LAB — C1 ESTERASE INHIBITOR, FUNCTIONAL: C1INH Functional/C1INH Total MFr SerPl: >100 % (ref >=68)

## 2011-08-18 LAB — COMPREHENSIVE METABOLIC PANEL
ALT: 36 U/L — ABNORMAL HIGH (ref 0–35)
AST: 52 U/L — ABNORMAL HIGH (ref 0–37)
CO2: 21 mEq/L (ref 19–32)
Chloride: 111 mEq/L (ref 96–112)
GFR calc non Af Amer: 78 mL/min — ABNORMAL LOW (ref >90)
Potassium: 3.7 mEq/L (ref 3.5–5.1)
Sodium: 139 mEq/L (ref 135–145)
Total Bilirubin: 0.3 mg/dL (ref 0.3–1.2)

## 2011-08-18 LAB — DIFFERENTIAL
Basophils Absolute: 0.1 10*3/uL (ref 0.0–0.1)
Lymphocytes Relative: 21 % (ref 12–46)
Monocytes Relative: 9 % (ref 3–12)
Neutro Abs: 4 10*3/uL (ref 1.7–7.7)

## 2011-08-18 LAB — CBC
Platelets: 305 10*3/uL (ref 150–400)
RBC: 4.75 MIL/uL (ref 3.87–5.11)
WBC: 6 10*3/uL (ref 4.0–10.5)

## 2011-08-19 LAB — BASIC METABOLIC PANEL
BUN: 11 mg/dL (ref 6–23)
Creatinine, Ser: 0.6 mg/dL (ref 0.50–1.10)
GFR calc Af Amer: >90 mL/min (ref >90)
GFR calc non Af Amer: 80 mL/min — ABNORMAL LOW (ref >90)
Glucose, Bld: 81 mg/dL (ref 70–99)

## 2011-08-20 LAB — BASIC METABOLIC PANEL
BUN: 11 mg/dL (ref 6–23)
Chloride: 117 mEq/L — ABNORMAL HIGH (ref 96–112)
Creatinine, Ser: 0.62 mg/dL (ref 0.50–1.10)
GFR calc Af Amer: >90 mL/min (ref >90)
Glucose, Bld: 82 mg/dL (ref 70–99)

## 2011-08-29 NOTE — Discharge Summary (Signed)
[name], [name]          ACCOUNT NO.:  [id number]  MEDICAL RECORD NO.:  [id number]  LOCATION:  1530                         FACILITY:  [facility]  PHYSICIAN:  Kela Millin, M.D.DATE OF BIRTH:  [birth year]  DATE OF ADMISSION:  08/13/2011 DATE OF DISCHARGE:  08/20/2011                        DISCHARGE SUMMARY - REFERRING   DISCHARGE DIAGNOSES: 1. Hypercalcemia, recurrent - Unclear etiology, dehydration/volume     depletion a contributing factor and other workup so far     unrevealing. 2. Acute renal failure - Resolved. 3. Probable urinary tract infection - Status post completion of     antibiotics. 4. Constipation/fecal impaction - Resolved, exacerbated by     hypercalcemia. 5. Advanced dementia. 6. Hypertension. 7. Hyponatremia. 8. History of syncope. 9. History of degenerative joint disease. 10.Status post cardiac catheterization in October 2004, showed an     ejection fraction of 55% to 60% with minimal coronary artery     disease. 11.History of hemorrhoids and diverticulosis noted per colonoscopy     performed in 2001 by Dr. Loreta Ave.  PROCEDURES AND STUDIES: 1. Chest x-ray on October 12th - No acute cardiopulmonary disease. 2. CT scan of the abdomen and pelvis on October 12th - Fecal impaction     of the rectum.  No evidence of bowel obstruction or perforation.     Mild biliary and pancreatic ductal dilatation.  No evidence of     pancreatic mass or surrounding inflammation.  Multiple renal cysts. 3. Abdominal x-ray on August 14, 2011 - Large amount of stool in the     rectum with stool seen throughout the colon.  Residual contrast     material in the colon.  No evidence of bowel obstruction. 4. Followup abdominal x-ray on October 16th - Large amount of stool     burden throughout the colon with a similar degree of dilatation.     No significant obstruction pattern seen.  No free air.  PERTINENT LABORATORY DATA: 1. Phosphorus 2.4. 2. Parathyroid hormone,  intact - 56.9. 3. C1 esterase inhibitor function is greater than 100. 4. Her corrected calcium today is 10.1 and the serum calcium is 8.3     from 12 on admission.  Her last albumin 1.7. Pending laboratories - Vitamin D 125 level.  HISTORY:  The patient is an 77 year old black female with the above- listed medical problems, who presented with complaints of weakness and diarrhea.  It is noted that she has advanced dementia and was brought in by her family.  Also, the patient had been seen in the emergency department on August 09, 2011, diagnosed with urinary tract infection and sent home on antibiotics.  She was seen in the ED with the above complaints and laboratory data revealed an elevated calcium of 12 as well as BUN of 48 with a creatinine of 134.  She was admitted for further evaluation and management.  Please see the full admission history and physical dictated on August 14, 2011 by Dr. Darnelle Catalan for the details of the admission history, physical exam, and laboratory data.  HOSPITAL COURSE: 1. Hypercalcemia, recurrent - As discussed above.  Upon admission, her     chemistries revealed an elevated calcium of 12 and the patient was  noted to be dehydrated/volume depleted and was started on IV fluids     for hydration.  It was also noted that she had had this in the past     and workup included a serum protein electrophoresis, vitamin D     studies, and PTH studies performed back in June and they were all     unrevealing and the impression was that her hypercalcemia was     secondary to dehydration in the setting of treatment with     hydrochlorothiazide.  During this hospitalization with the     hydration, the patient's corrected calcium was still elevated and     so, she was treated with IV pamidronate and calcitonin as well.     With this her hypercalcemia is completely resolved at this time and     her serum calcium is 8.3 and the corrected calcium is 10.1 (her     last albumin  during this hospital stay was 1.7).  Her intact PTH     came back within normal limits as well as phosphorus.  Vitamin D     125 is pending at this time of dictation.  Based on the study     results at the back, dehydration was the likely factor here as     well, the patient is to follow up with Dr. Willey Blade for the     results of pending vitamin D 125 and further monitoring and     evaluation/management as outpatient as clinically appropriate. 2. Acute renal failure - Resolved with hydration.  Her creatinine     prior to discharge today is 0.62 with a BUN of 11.  The etiology of     this was prerenal.  The patient's meloxicam was also discontinued     as this was thought to be a contributing factor. 3. Fecal impaction/constipation - The patient was disimpacted and     placed on a bowel regimen and her constipation was resolved at this     time.  The MiraLax has been changed to p.r.n. basis as the patient     began having multiple stools overnight on the bowel regimen she was     receiving. 4. Dementia - The patient was maintained on her outpatient medications     during this hospital stay. 5. Probable urinary tract infection - The patient completed a 7-day     course of IV antibiotics during this hospital stay and will not     require any further antibiotics upon discharge. 6. Her other chronic medical conditions remained stable during this     hospital stay and she was maintained on her outpatient medications     except as indicated above.  DISCHARGE MEDICATIONS: 1. Tylenol 325 mg 2 tablets q.4 h. p.r.n. 2. Ensure 237 cc b.i.d. 3. Ensure Pudding 113 cc b.i.d. 4. MiraLax 17 g daily p.r.n. 5. Norvasc 5 mg p.o. daily. 6. Donepezil 5 mg p.o. daily. 7. Multivitamins 1 p.o. daily.  DISCONTINUED MEDICATIONS:  Meloxicam.  FOLLOWUP CARE:  Dr. Willey Blade in 1 to 2 weeks, call for appointment.  DISCHARGE CONDITION:  Improved/stable.     Kela Millin, M.D.     ACV/MEDQ  D:   08/20/2011  T:  08/20/2011  Job:  [id number]  Electronically Signed by Donnalee Curry M.D. on 08/29/2011 03:22:15 PM

## 2011-10-05 ENCOUNTER — Ambulatory Visit (HOSPITAL_COMMUNITY): Payer: Medicare Other

## 2011-10-06 ENCOUNTER — Ambulatory Visit (HOSPITAL_COMMUNITY)
Admission: RE | Admit: 2011-10-06 | Discharge: 2011-10-06 | Disposition: A | Discharge status: Home / Self Care | Payer: Medicare Other | Source: Ambulatory Visit | Attending: Internal Medicine | Admitting: Internal Medicine

## 2011-10-06 DIAGNOSIS — M7989 Other specified soft tissue disorders: Secondary | ICD-10-CM | POA: Insufficient documentation

## 2011-10-06 DIAGNOSIS — M79609 Pain in unspecified limb: Secondary | ICD-10-CM

## 2011-10-06 DIAGNOSIS — M79606 Pain in leg, unspecified: Secondary | ICD-10-CM

## 2011-10-06 NOTE — Progress Notes (Signed)
*  PRELIMINARY RESULTS* Lower venous dopplers performed. Preliminary findings showed no obvious evidence DVT, superficial thrombus or Bakers Cyst bilateral. [name] 10/06/2011, 3:59 PM

## 2012-03-22 ENCOUNTER — Other Ambulatory Visit: Payer: Self-pay | Admitting: Internal Medicine

## 2012-03-22 ENCOUNTER — Ambulatory Visit
Admission: RE | Admit: 2012-03-22 | Discharge: 2012-03-22 | Disposition: A | Discharge status: Home / Self Care | Payer: Medicare Other | Source: Ambulatory Visit | Attending: Internal Medicine | Admitting: Internal Medicine

## 2012-03-22 DIAGNOSIS — R1032 Left lower quadrant pain: Secondary | ICD-10-CM

## 2013-03-09 ENCOUNTER — Encounter: Payer: Self-pay | Admitting: Internal Medicine

## 2013-11-08 ENCOUNTER — Other Ambulatory Visit (HOSPITAL_COMMUNITY): Payer: Self-pay | Admitting: Internal Medicine

## 2013-11-08 DIAGNOSIS — R1310 Dysphagia, unspecified: Secondary | ICD-10-CM

## 2013-11-12 ENCOUNTER — Ambulatory Visit (HOSPITAL_COMMUNITY): Admission: RE | Admit: 2013-11-12 | Payer: Medicare Other | Source: Ambulatory Visit

## 2013-11-12 ENCOUNTER — Other Ambulatory Visit (HOSPITAL_COMMUNITY): Payer: Medicare Other

## 2013-11-23 ENCOUNTER — Ambulatory Visit (HOSPITAL_COMMUNITY)
Admission: RE | Admit: 2013-11-23 | Discharge: 2013-11-23 | Disposition: A | Discharge status: Home / Self Care | Payer: Medicare Other | Source: Ambulatory Visit | Attending: Internal Medicine | Admitting: Internal Medicine

## 2013-11-23 DIAGNOSIS — I129 Hypertensive chronic kidney disease with stage 1 through stage 4 chronic kidney disease, or unspecified chronic kidney disease: Secondary | ICD-10-CM | POA: Insufficient documentation

## 2013-11-23 DIAGNOSIS — R1311 Dysphagia, oral phase: Secondary | ICD-10-CM | POA: Insufficient documentation

## 2013-11-23 DIAGNOSIS — N182 Chronic kidney disease, stage 2 (mild): Secondary | ICD-10-CM | POA: Insufficient documentation

## 2013-11-23 DIAGNOSIS — F0280 Dementia in other diseases classified elsewhere without behavioral disturbance: Secondary | ICD-10-CM | POA: Insufficient documentation

## 2013-11-23 DIAGNOSIS — R1310 Dysphagia, unspecified: Secondary | ICD-10-CM

## 2013-11-23 DIAGNOSIS — G309 Alzheimer's disease, unspecified: Secondary | ICD-10-CM | POA: Insufficient documentation

## 2013-11-23 NOTE — Procedures (Signed)
Objective Swallowing Evaluation: Modified Barium Swallowing Study  Patient Details  Name: [name] MRN: [id number] Date of Birth: [birth year]  Today's Date: 11/23/2013 Time: 1300-1340 SLP Time Calculation (min): 40 min  Past Medical History:  Past Medical History  Diagnosis Date  . Urinary tract infection, site not specified   . Hematuria, unspecified   . Dysuria   . Pain in joint, site unspecified   . Alzheimer's disease   . Essential hypertension, malignant   . Osteoarthrosis, unspecified whether generalized or localized, unspecified site   . Chronic kidney disease, stage II (mild)   . Unspecified vitamin D deficiency    Past Surgical History: No past surgical history on file. HPI:  80 year old female referred by Dr. Kevan Ny for OP MBS secondary to difficulty chewing, throat clearing during meals, coughing up mucous, sensation of food sticking in throat, and poor appetite.  MD questions aspiration.  PMH: Alzheimer's Dementia, HTN, DJD.  Pt's son reports pt. has not been eating well, throwing away most of her food, and does not want to eat.  Son is concerned about weight lose.     Assessment / Plan / Recommendation Clinical Impression  Dysphagia Diagnosis: Suspected primary esophageal dysphagia;Mild oral phase dysphagia Clinical impression: Pt. exhibits a mild oral dysphagia with difficulty chewing solids, resulting in prolonged oral transit.  Pt. swallowed soft solids and liquids without difficulty.  There was no aspiration or penetration with any consistency.  There is a suspected primary esophageal dysphagia, with quetion of stricture or narrowing at the GE juncture.  No Radiologist was present to confirm these observations, however, the  pill appeared to lodge at the LES and liquids, given in an effort to push the pill forward, appeared to backflow to the thoracic esophagus.  A bite of applesauce was administered in attempt to move the pill into the stomach, which  remained above the pill.  Eventually, the pill and the applesauce cleared.  Recommend f/u GI consult for esophageal w/u.  Question if esophageal dilitation would be beneficial.     Treatment Recommendation  No treatment recommended at this time    Diet Recommendation Dysphagia 2 (Fine chop);Thin liquid   Liquid Administration via: Spoon;Cup;Straw Medication Administration: Whole meds with liquid (Crush if pills are large) Supervision: Patient able to self feed;Full supervision/cueing for compensatory strategies Compensations: Slow rate;Small sips/bites;Follow solids with liquid Postural Changes and/or Swallow Maneuvers: Seated upright 90 degrees    Other  Recommendations Recommended Consults: Consider GI evaluation;Consider esophageal assessment Oral Care Recommendations: Oral care BID Other Recommendations: Clarify dietary restrictions   Follow Up Recommendations  24 hour supervision/assistance    Frequency and Duration        Pertinent Vitals/Pain n/a    SLP Swallow Goals  n/a   General HPI: 80 year old female referred by Dr. Kevan Ny for OP MBS secondary to difficulty chewing, throat clearing during meals, coughing up mucous, sensation of food sticking in throat, and poor appetite.  MD questions aspiration.  PMH: Alzheimer's Dementia, HTN, DJD.  Pt's son reports pt. has not been eating well, throwing away most of her food, and does not want to eat.  Son is concerned about weight lose. Type of Study: Modified Barium Swallowing Study Reason for Referral: Objectively evaluate swallowing function Previous Swallow Assessment: none Diet Prior to this Study: Dysphagia 3 (soft);Thin liquids Temperature Spikes Noted: No Respiratory Status: Room air History of Recent Intubation: Yes Behavior/Cognition: Alert;Cooperative;Pleasant mood Oral Cavity - Dentition: Adequate natural dentition Oral Motor /  Sensory Function: Within functional limits Self-Feeding Abilities: Able to feed  self Patient Positioning: Upright in chair Baseline Vocal Quality: Clear Volitional Cough: Strong Volitional Swallow: Able to elicit Anatomy: Within functional limits Pharyngeal Secretions: Not observed secondary MBS    Reason for Referral Objectively evaluate swallowing function   Oral Phase Oral Preparation/Oral Phase Oral Phase: Impaired Oral - Solids Oral - Mechanical Soft: Impaired mastication;Piecemeal swallowing;Delayed oral transit Oral - Regular: Impaired mastication;Piecemeal swallowing;Delayed oral transit   Pharyngeal Phase Pharyngeal Phase Pharyngeal Phase: Within functional limits  Cervical Esophageal Phase    GO    Cervical Esophageal Phase Cervical Esophageal Phase: Silverio Lay T 11/23/2013, 1:59 PM

## 2014-04-11 ENCOUNTER — Inpatient Hospital Stay (HOSPITAL_COMMUNITY)
Admission: EM | Admit: 2014-04-11 | Discharge: 2014-04-13 | DRG: 063 | Disposition: A | Discharge status: Home Health | Payer: Medicare Other | Attending: Neurology | Admitting: Neurology

## 2014-04-11 ENCOUNTER — Inpatient Hospital Stay (HOSPITAL_COMMUNITY): Payer: Medicare Other

## 2014-04-11 ENCOUNTER — Encounter (HOSPITAL_COMMUNITY): Payer: Self-pay

## 2014-04-11 ENCOUNTER — Emergency Department (HOSPITAL_COMMUNITY): Payer: Medicare Other

## 2014-04-11 DIAGNOSIS — M810 Age-related osteoporosis without current pathological fracture: Secondary | ICD-10-CM | POA: Diagnosis present

## 2014-04-11 DIAGNOSIS — Z7982 Long term (current) use of aspirin: Secondary | ICD-10-CM

## 2014-04-11 DIAGNOSIS — I359 Nonrheumatic aortic valve disorder, unspecified: Secondary | ICD-10-CM

## 2014-04-11 DIAGNOSIS — I6350 Cerebral infarction due to unspecified occlusion or stenosis of unspecified cerebral artery: Principal | ICD-10-CM | POA: Diagnosis present

## 2014-04-11 DIAGNOSIS — G309 Alzheimer's disease, unspecified: Secondary | ICD-10-CM | POA: Diagnosis present

## 2014-04-11 DIAGNOSIS — F0280 Dementia in other diseases classified elsewhere without behavioral disturbance: Secondary | ICD-10-CM | POA: Diagnosis present

## 2014-04-11 DIAGNOSIS — I639 Cerebral infarction, unspecified: Secondary | ICD-10-CM | POA: Diagnosis present

## 2014-04-11 DIAGNOSIS — N182 Chronic kidney disease, stage 2 (mild): Secondary | ICD-10-CM | POA: Diagnosis present

## 2014-04-11 DIAGNOSIS — R4701 Aphasia: Secondary | ICD-10-CM | POA: Diagnosis present

## 2014-04-11 DIAGNOSIS — I129 Hypertensive chronic kidney disease with stage 1 through stage 4 chronic kidney disease, or unspecified chronic kidney disease: Secondary | ICD-10-CM | POA: Diagnosis present

## 2014-04-11 HISTORY — DX: Essential (primary) hypertension: I10

## 2014-04-11 LAB — COMPREHENSIVE METABOLIC PANEL
ALT: 18 U/L (ref 0–35)
AST: 26 U/L (ref 0–37)
Albumin: 3.6 g/dL (ref 3.5–5.2)
Alkaline Phosphatase: 72 U/L (ref 39–117)
BUN: 19 mg/dL (ref 6–23)
CALCIUM: 12 mg/dL — AB (ref 8.4–10.5)
CO2: 24 meq/L (ref 19–32)
CREATININE: 1.09 mg/dL (ref 0.50–1.10)
Chloride: 102 mEq/L (ref 96–112)
GFR, EST AFRICAN AMERICAN: 50 mL/min — AB (ref >90)
GFR, EST NON AFRICAN AMERICAN: 43 mL/min — AB (ref >90)
GLUCOSE: 90 mg/dL (ref 70–99)
Potassium: 4 mEq/L (ref 3.7–5.3)
SODIUM: 139 meq/L (ref 137–147)
TOTAL PROTEIN: 7.2 g/dL (ref 6.0–8.3)
Total Bilirubin: 0.4 mg/dL (ref 0.3–1.2)

## 2014-04-11 LAB — URINALYSIS, ROUTINE W REFLEX MICROSCOPIC
BILIRUBIN URINE: NEGATIVE
GLUCOSE, UA: NEGATIVE mg/dL
Hgb urine dipstick: NEGATIVE
KETONES UR: NEGATIVE mg/dL
Leukocytes, UA: NEGATIVE
Nitrite: NEGATIVE
PH: 7 (ref 5.0–8.0)
PROTEIN: NEGATIVE mg/dL
Specific Gravity, Urine: 1.008 (ref 1.005–1.030)
Urobilinogen, UA: 1 mg/dL (ref 0.0–1.0)

## 2014-04-11 LAB — CBC WITH DIFFERENTIAL/PLATELET
BASOS ABS: 0 10*3/uL (ref 0.0–0.1)
Basophils Relative: 1 % (ref 0–1)
Eosinophils Absolute: 0.1 10*3/uL (ref 0.0–0.7)
Eosinophils Relative: 1 % (ref 0–5)
HEMATOCRIT: 38.5 % (ref 36.0–46.0)
HEMOGLOBIN: 12.7 g/dL (ref 12.0–15.0)
LYMPHS PCT: 30 % (ref 12–46)
Lymphs Abs: 1.9 10*3/uL (ref 0.7–4.0)
MCH: 26.9 pg (ref 26.0–34.0)
MCHC: 33 g/dL (ref 30.0–36.0)
MCV: 81.6 fL (ref 78.0–100.0)
MONO ABS: 0.5 10*3/uL (ref 0.1–1.0)
MONOS PCT: 8 % (ref 3–12)
Neutro Abs: 3.9 10*3/uL (ref 1.7–7.7)
Neutrophils Relative %: 60 % (ref 43–77)
Platelets: 246 10*3/uL (ref 150–400)
RBC: 4.72 MIL/uL (ref 3.87–5.11)
RDW: 13.8 % (ref 11.5–15.5)
WBC: 6.4 10*3/uL (ref 4.0–10.5)

## 2014-04-11 LAB — CK TOTAL AND CKMB (NOT AT ARMC)
CK, MB: 1.8 ng/mL (ref 0.3–4.0)
RELATIVE INDEX: INVALID (ref 0.0–2.5)
Total CK: 43 U/L (ref 7–177)

## 2014-04-11 LAB — I-STAT CHEM 8, ED
BUN: 19 mg/dL (ref 6–23)
CHLORIDE: 103 meq/L (ref 96–112)
CREATININE: 1.2 mg/dL — AB (ref 0.50–1.10)
Calcium, Ion: 1.39 mmol/L — ABNORMAL HIGH (ref 1.13–1.30)
GLUCOSE: 88 mg/dL (ref 70–99)
HCT: 42 % (ref 36.0–46.0)
HEMOGLOBIN: 14.3 g/dL (ref 12.0–15.0)
POTASSIUM: 3.8 meq/L (ref 3.7–5.3)
SODIUM: 138 meq/L (ref 137–147)
TCO2: 23 mmol/L (ref 0–100)

## 2014-04-11 LAB — CBG MONITORING, ED: Glucose-Capillary: 69 mg/dL — ABNORMAL LOW (ref 70–99)

## 2014-04-11 LAB — I-STAT TROPONIN, ED: TROPONIN I, POC: 0 ng/mL (ref 0.00–0.08)

## 2014-04-11 LAB — RAPID URINE DRUG SCREEN, HOSP PERFORMED
AMPHETAMINES: NOT DETECTED
BENZODIAZEPINES: NOT DETECTED
Barbiturates: NOT DETECTED
COCAINE: NOT DETECTED
Opiates: NOT DETECTED
Tetrahydrocannabinol: NOT DETECTED

## 2014-04-11 LAB — PROTIME-INR
INR: 0.97 (ref 0.00–1.49)
PROTHROMBIN TIME: 12.7 s (ref 11.6–15.2)

## 2014-04-11 LAB — APTT: APTT: 29 s (ref 24–37)

## 2014-04-11 LAB — ETHANOL: Alcohol, Ethyl (B): <11 mg/dL (ref 0–11)

## 2014-04-11 LAB — MRSA PCR SCREENING: MRSA BY PCR: POSITIVE — AB

## 2014-04-11 MED ORDER — SODIUM CHLORIDE 0.9 % IV SOLN
INTRAVENOUS | Status: DC
  Administered 2014-04-11: 14:00:00 via INTRAVENOUS
  Administered 2014-04-12: 75 mL/h via INTRAVENOUS
  Administered 2014-04-12: 10:00:00 via INTRAVENOUS

## 2014-04-11 MED ORDER — LABETALOL HCL 5 MG/ML IV SOLN: 10.0000 mg | INTRAVENOUS | Status: DC | PRN

## 2014-04-11 MED ORDER — PANTOPRAZOLE SODIUM 40 MG IV SOLR
40.0000 mg | Freq: Every day | INTRAVENOUS | Status: DC
  Administered 2014-04-11 – 2014-04-12 (×2): 40 mg via INTRAVENOUS
  Filled 2014-04-11 (×4): qty 40

## 2014-04-11 MED ORDER — ACETAMINOPHEN 325 MG PO TABS
650.0000 mg | ORAL_TABLET | ORAL | Status: DC | PRN
  Administered 2014-04-12: 650 mg via ORAL
  Filled 2014-04-11: qty 2

## 2014-04-11 MED ORDER — SODIUM CHLORIDE 0.9 % IV BOLUS (SEPSIS)
500.0000 mL | Freq: Once | INTRAVENOUS | Status: AC
  Administered 2014-04-11: 500 mL via INTRAVENOUS

## 2014-04-11 MED ORDER — ALTEPLASE (STROKE) FULL DOSE INFUSION
0.9000 mg/kg | Freq: Once | INTRAVENOUS | Status: AC
  Administered 2014-04-11: 49 mg via INTRAVENOUS
  Filled 2014-04-11: qty 49

## 2014-04-11 MED ORDER — CHLORHEXIDINE GLUCONATE CLOTH 2 % EX PADS
6.0000 | MEDICATED_PAD | Freq: Every day | CUTANEOUS | Status: DC
  Administered 2014-04-12 – 2014-04-13 (×2): 6 via TOPICAL

## 2014-04-11 MED ORDER — ACETAMINOPHEN 650 MG RE SUPP
650.0000 mg | RECTAL | Status: DC | PRN
Start: 2014-04-11 — End: 2014-04-13

## 2014-04-11 MED ORDER — MUPIROCIN 2 % EX OINT
1.0000 "application " | TOPICAL_OINTMENT | Freq: Two times a day (BID) | CUTANEOUS | Status: DC
  Administered 2014-04-11 – 2014-04-13 (×4): 1 via NASAL
  Filled 2014-04-11: qty 22

## 2014-04-11 NOTE — ED Notes (Signed)
IV attempted x3 IV team paged x 2

## 2014-04-11 NOTE — H&P (Signed)
Stroke History and physical    Chief Complaint: Code stroke  HPI:                                                                                                                                         [name] is an 80 y.o. female with known dementia and HTN.  She has a 24/7 caregiver who was with her at time of event.  Pateitn had just had a bath at 1200 and appeared normal at that time. At 12:15 caregiver felt she was not speaking clearly or coherently. EMS was called and patient was brought to ED.  On arrival patient had an aphasia, right upper quadrant field cut.  Initially tPA was offered to son and son refused. However after speaking to patients PCP, the son then reconsidered and decided to go forward with giving tPA.    Date last known well: Date: 04/11/2014 Time last known well: Time: 12:00 tPA Given: Yes, delayed as son initially refused treatment until speaking with PCP.  NIHSS 5  Past Medical History  Diagnosis Date  . Urinary tract infection, site not specified   . Hematuria, unspecified   . Dysuria   . Pain in joint, site unspecified   . Alzheimer's disease   . Essential hypertension, malignant   . Osteoarthrosis, unspecified whether generalized or localized, unspecified site   . Chronic kidney disease, stage II (mild)   . Unspecified vitamin D deficiency   . Hypertension     History reviewed. No pertinent past surgical history.  Family History  Problem Relation Age of Onset  . Hypertension Mother   . Hypertension Father    Social History:  reports that she has quit smoking. She does not have any smokeless tobacco history on file. She reports that she does not drink alcohol. Her drug history is not on file.  Allergies:  Allergies  Allergen Reactions  . Cephalexin   . Penicillins     Medications:                                                                                                                           Current  Facility-Administered Medications  Medication Dose Route Frequency Provider Last Rate Last Dose  . alteplase (ACTIVASE) 1 mg/mL infusion 49 mg  0.9 mg/kg Intravenous Once Roland Rack, MD  Current Outpatient Prescriptions  Medication Sig Dispense Refill  . Acetaminophen (TYLENOL PO) Take by mouth as needed. For pain       . aspirin 81 MG tablet Take 81 mg by mouth daily.        Marland Kitchen CALCIUM-MAGNESIUM-ZINC PO Take by mouth daily.        . Cyanocobalamin (VITAMIN B-12 PO) Take 500 mg by mouth daily.        Marland Kitchen donepezil (ARICEPT) 5 MG tablet Take 5 mg by mouth daily.        Marland Kitchen GINKGO BILOBA PO Take by mouth daily.        . meloxicam (MOBIC) 7.5 MG tablet Take 7.5 mg by mouth daily as needed.        . Multiple Vitamin (MULTIVITAMIN) tablet Take 1 tablet by mouth daily.        . nitroGLYCERIN (NITROSTAT) 0.4 MG SL tablet Place 0.4 mg under the tongue.        . TRIAMTERENE-HCTZ PO Take 25 mg by mouth daily.           ROS:                                                                                                                                       History obtained from caregiver  General ROS: negative for - chills, fatigue, fever, night sweats, weight gain or weight loss Psychological ROS: negative for - behavioral disorder, hallucinations, memory difficulties, mood swings or suicidal ideation Ophthalmic ROS: negative for - blurry vision, double vision, eye pain or loss of vision ENT ROS: negative for - epistaxis, nasal discharge, oral lesions, sore throat, tinnitus or vertigo Allergy and Immunology ROS: negative for - hives or itchy/watery eyes Hematological and Lymphatic ROS: negative for - bleeding problems, bruising or swollen lymph nodes Endocrine ROS: negative for - galactorrhea, hair pattern changes, polydipsia/polyuria or temperature intolerance Respiratory ROS: negative for - cough, hemoptysis, shortness of breath or wheezing Cardiovascular ROS: negative for - chest  pain, dyspnea on exertion, edema or irregular heartbeat Gastrointestinal ROS: negative for - abdominal pain, diarrhea, hematemesis, nausea/vomiting or stool incontinence Genito-Urinary ROS: negative for - dysuria, hematuria, incontinence or urinary frequency/urgency Musculoskeletal ROS: negative for - joint swelling or muscular weakness Neurological ROS: as noted in HPI Dermatological ROS: negative for rash and skin lesion changes  Neurologic Examination:  Temperature 97.9 F (36.6 C), temperature source Axillary, weight 54.9 kg (121 lb 0.5 oz).  General Lungs--CTAB CV-RRR, S1, S2 Abdomen--BS in all 4 quadrant Skin--WDI  Mental Status: Alert, not oriented to place, month, year or birth date or age, thought content appropriate.  Speech showed moderate aphasia --difficulty with naming and following commands. Able to follow simple commands but often needed prompting. Cranial Nerves: II: Discs flat bilaterally; showed a right upper quadrant field cut, pupils equal, round, reactive to light and accommodation III,IV, VI: ptosis not present, extra-ocular motions intact bilaterally V,VII: smile symmetric, facial light touch sensation normal bilaterally VIII: hearing normal bilaterally IX,X: gag reflex present XI: bilateral shoulder shrug XII: midline tongue extension without atrophy or fasciculations  Motor: Moving all extremities antigravity Sensory: Pinprick and light touch intact throughout, bilaterally Deep Tendon Reflexes:  1+ bilateral UE and KJ with no AJ Plantars: Right: downgoing   Left: downgoing Cerebellar: normal finger-to-nose,  Unable to obtain heel-to-shin test Gait: not tested CV: pulses palpable throughout    Lab Results: Basic Metabolic Panel:  Recent Labs Lab 04/11/14 1317  NA 138  K 3.8  CL 103  GLUCOSE 88  BUN 19  CREATININE 1.20*    Liver Function  Tests: No results found for this basename: AST, ALT, ALKPHOS, BILITOT, PROT, ALBUMIN,  in the last 168 hours No results found for this basename: LIPASE, AMYLASE,  in the last 168 hours No results found for this basename: AMMONIA,  in the last 168 hours  CBC:  Recent Labs Lab 04/11/14 1310 04/11/14 1317  WBC 6.4  --   NEUTROABS 3.9  --   HGB 12.7 14.3  HCT 38.5 42.0  MCV 81.6  --   PLT 246  --     Cardiac Enzymes: No results found for this basename: CKTOTAL, CKMB, CKMBINDEX, TROPONINI,  in the last 168 hours  Lipid Panel: No results found for this basename: CHOL, TRIG, HDL, CHOLHDL, VLDL, LDLCALC,  in the last 168 hours  CBG:  Recent Labs Lab 04/11/14 1349  GLUCAP 69*    Microbiology: Results for orders placed during the hospital encounter of 04/20/11  URINE CULTURE     Status: None   Collection Time    04/20/11  3:30 PM      Result Value Ref Range Status   Specimen Description URINE, RANDOM   Final   Special Requests NONE   Final   Culture  Setup Time [id number]   Final   Colony Count 90,000 COLONIES/ML   Final   Culture     Final   Value: Multiple bacterial morphotypes present, none predominant. Suggest appropriate recollection if clinically indicated.   Report Status 04/21/2011 FINAL   Final  CULTURE, BLOOD (ROUTINE X 2)     Status: None   Collection Time    04/20/11  7:33 PM      Result Value Ref Range Status   Specimen Description BLOOD RIGHT ARM   Final   Special Requests BOTTLES DRAWN AEROBIC ONLY 4CC   Final   Culture  Setup Time [id number]   Final   Culture NO GROWTH 5 DAYS   Final   Report Status 04/27/2011 FINAL   Final  CULTURE, BLOOD (ROUTINE X 2)     Status: None   Collection Time    04/20/11  7:34 PM      Result Value Ref Range Status   Specimen Description BLOOD LEFT WRIST   Final   Special Requests BOTTLES DRAWN AEROBIC ONLY 4CC  Final   Culture  Setup Time [id number]   Final   Culture NO GROWTH 5 DAYS   Final   Report Status  04/27/2011 FINAL   Final    Coagulation Studies: No results found for this basename: LABPROT, INR,  in the last 72 hours  Imaging: Ct Head Wo Contrast  04/11/2014   CLINICAL DATA:  Code stroke.  EXAM: CT HEAD WITHOUT CONTRAST  TECHNIQUE: Contiguous axial images were obtained from the base of the skull through the vertex without intravenous contrast.  COMPARISON:  CT head 06/11/2011, 04/20/2011.  MRI brain 02/21/2006.  FINDINGS: Moderate age related cortical, deep, and cerebellar atrophy, unchanged since 2012 but progressive since 2007. Mild changes of small vessel disease of the white matter, unchanged since 2007. Physiologic calcifications in the basal ganglia, unchanged. No mass lesion. No midline shift. No acute hemorrhage or hematoma. No extra-axial fluid collections. No evidence of acute infarction.  Hyperostosis interna involving the frontal and temporal bones bilaterally. Ossification involving anterior falx. Visualized paranasal sinuses, bilateral mastoid air cells, and bilateral middle ear cavities well-aerated. Bilateral carotid siphon and vertebral artery atherosclerosis.  IMPRESSION: 1. No acute intracranial abnormality. 2. Moderate generalized atrophy and mild chronic microvascular ischemic changes of the white matter.   Electronically Signed   By: Evangeline Dakin M.D.   On: 04/11/2014 13:55    Assessment and plan discussed with with attending physician and they are in agreement.    Etta Quill PA-C [facility] [phone]  04/11/2014, 1:59 PM   Assessment: 80 y.o. female who presented to ED with acute onset of aphasia. On exam patient exhibited a right sided field cut and moderate aphasia. Initial CT head was negative for stroke or bleed. Given these symptoms the risks and benefits of tpa were discussed with the son and it was agreed to go ahead with tpa.   Stroke Risk Factors - hypertension  1. HgbA1c, fasting lipid panel 2. CT head in AM, MRI, MRA  of the brain  without contrast 3. PT consult, OT consult, Speech consult 4. Echocardiogram 5. Carotid dopplers 6. Risk factor modification 7. Telemetry monitoring 8. Frequent neuro checks 9 admit to ICU  I have seen and evaluated the patient. I have reviewed the above note and made appropriate changes.   This patient is critically ill and at significant risk of neurological worsening, death and care requires constant monitoring of vital signs, hemodynamics,respiratory and cardiac monitoring, neurological assessment, discussion with family, other specialists and medical decision making of high complexity. I spent 60 minutes of neurocritical care time  in the care of  this patient.  Roland Rack, MD [facility] [phone]  If 7pm- 7am, please page neurology on call as listed in [place]. 04/11/2014  6:39 PM

## 2014-04-11 NOTE — ED Notes (Signed)
Pt reports that she had a headache that comes and goes. Dr. Leonel Ramsay aware

## 2014-04-11 NOTE — Code Documentation (Signed)
80yo female arriving to [facility] via EMS.  EMS reports that the patient lives with her nephew and was at home when she developed sudden onset garbled speech at 72.  Patient with a h/o dementia, hypertension, and heart murmur, takes ASA.  Patient taken to CT on arrival.  NIHSS 5, see documentation for details.  Dr. Leonel Ramsay at bedside for assessment.  Family at bedside.  Patient continuing to have expressive aphasia and right field cut.  MD offered to treat patient with tPA, but family declined.  Family later talked with the patient's PCP over the phone and decided to go forward with tPA.  Patient was a difficult PIV start requiring multiple attempts to place 2nd PIV.  tPA started at 1414 with a 5mg  bolus at 1414 and then 78ml/hr.  Patient to be admitted to NICU.  Plan of care discussed with patient and family.  Bedside handoff with ED RN Velna Hatchet.

## 2014-04-11 NOTE — ED Notes (Signed)
89 pt from home with Care Taker noticed pt. To have gargled speech around 12:15. Per care taker pt was unable to ambulate and was not grasping hand. LKW 12 noon. Pt via EMS unable to get report.

## 2014-04-11 NOTE — ED Provider Notes (Signed)
TIME SEEN: 1:07 PM  CHIEF COMPLAINT: Code stroke  HPI: Patient is a 80 year old female with history of Alzheimer's dementia, hypertension, chronic kidney disease who presents to the emergency department as a code stroke. Patient was last seen normal at her family at 12:15 PM today. They state they noted she had some garbled speech and seemed confused. No history of head injury. She is not complaining of pain. She is not on anticoagulation. Her blood sugar was in the 200s with EMS.  ROS: See HPI Constitutional: no fever  Eyes: no drainage  ENT: no runny nose   Cardiovascular:  no chest pain  Resp: no SOB  GI: no vomiting GU: no dysuria Integumentary: no rash  Allergy: no hives  Musculoskeletal: no leg swelling  Neurological: no slurred speech ROS otherwise negative  PAST MEDICAL HISTORY/PAST SURGICAL HISTORY:  Past Medical History  Diagnosis Date  . Urinary tract infection, site not specified   . Hematuria, unspecified   . Dysuria   . Pain in joint, site unspecified   . Alzheimer's disease   . Essential hypertension, malignant   . Osteoarthrosis, unspecified whether generalized or localized, unspecified site   . Chronic kidney disease, stage II (mild)   . Unspecified vitamin D deficiency     MEDICATIONS:  Prior to Admission medications   Medication Sig Start Date End Date Taking? Authorizing Provider  Acetaminophen (TYLENOL PO) Take by mouth as needed. For pain     Historical Provider, MD  aspirin 81 MG tablet Take 81 mg by mouth daily.      Historical Provider, MD  CALCIUM-MAGNESIUM-ZINC PO Take by mouth daily.      Historical Provider, MD  Cyanocobalamin (VITAMIN B-12 PO) Take 500 mg by mouth daily.      Historical Provider, MD  donepezil (ARICEPT) 5 MG tablet Take 5 mg by mouth daily.      Historical Provider, MD  GINKGO BILOBA PO Take by mouth daily.      Historical Provider, MD  meloxicam (MOBIC) 7.5 MG tablet Take 7.5 mg by mouth daily as needed.      Historical  Provider, MD  Multiple Vitamin (MULTIVITAMIN) tablet Take 1 tablet by mouth daily.      Historical Provider, MD  nitroGLYCERIN (NITROSTAT) 0.4 MG SL tablet Place 0.4 mg under the tongue.      Historical Provider, MD  TRIAMTERENE-HCTZ PO Take 25 mg by mouth daily.      Historical Provider, MD    ALLERGIES:  Allergies  Allergen Reactions  . Cephalexin   . Penicillins     SOCIAL HISTORY:  History  Substance Use Topics  . Smoking status: Not on file  . Smokeless tobacco: Not on file  . Alcohol Use: Not on file    FAMILY HISTORY: No family history on file.  EXAM: Temp(Src) 97.9 F (36.6 C) (Axillary)  Wt 121 lb 0.5 oz (54.9 kg) CONSTITUTIONAL: Alert and oriented to person but not place and time and responds appropriately to questions only intermittently. Well-appearing; well-nourished, elderly, in no apparent distress HEAD: Normocephalic EYES: Conjunctivae clear, PERRL ENT: normal nose; no rhinorrhea; moist mucous membranes; pharynx without lesions noted NECK: Supple, no meningismus, no LAD  CARD: RRR; S1 and S2 appreciated; no murmurs, no clicks, no rubs, no gallops RESP: Normal chest excursion without splinting or tachypnea; breath sounds clear and equal bilaterally; no wheezes, no rhonchi, no rales,  ABD/GI: Normal bowel sounds; non-distended; soft, non-tender, no rebound, no guarding BACK:  The back appears normal and  is non-tender to palpation, there is no CVA tenderness EXT: Normal ROM in all joints; non-tender to palpation; no edema; normal capillary refill; no cyanosis    SKIN: Normal color for age and race; warm NEURO: Moves all extremities equally, patient does have some aphasia and dysarthria; no sensory deficit; pt appears to have a right visual field deficit but difficult to test as patient has a hard time following commands; NIHSS 3-4 PSYCH: The patient's mood and manner are appropriate. Grooming and personal hygiene are appropriate.  MEDICAL DECISION MAKING:  Patient here with likely stroke. She has an NIH stroke scale of 3-4.  She is hemodynamically stable. Head CT shows no acute abnormality. Neurology at bedside. Labs and urine pending.   1:45 PM  Have offered patient's family TPA and at this time they do not comfortable with TPA given the risks. We'll admit to hospitalist. PCP with [facility] physicians. Patient's labs are unremarkable other than a creatinine of 1.20.  Pt will have intermittently worsening dysarthria.  1:56 PM  Pt's son has now spoke with her primary care physician Dr. [name] who recommended giving TPA. Family now would like TPA. Dr. Leonel Ramsay with neurology to admit the patient to the ICU. Family consented for TPA. Patient's blood pressure is normal.   CRITICAL CARE Performed by: Nyra Jabs   Total critical care time: 60 minutes  Critical care time was exclusive of separately billable procedures and treating other patients.  Critical care was necessary to treat or prevent imminent or life-threatening deterioration.  Critical care was time spent personally by me on the following activities: development of treatment plan with patient and/or surrogate as well as nursing, discussions with consultants, evaluation of patient's response to treatment, examination of patient, obtaining history from patient or surrogate, ordering and performing treatments and interventions, ordering and review of laboratory studies, ordering and review of radiographic studies, pulse oximetry and re-evaluation of patient's condition.    EKG Interpretation  Date/Time:  Thursday April 11 2014 13:27:41 EDT Ventricular Rate:  56 PR Interval:  141 QRS Duration: 76 QT Interval:  432 QTC Calculation: 417 R Axis:   31 Text Interpretation:  Sinus rhythm Borderline T abnormalities, inferior leads Baseline wander in lead(s) V6 Confirmed by WARD,  DO, KRISTEN [id number]) on 04/11/2014 1:37:54 PM        [place], DO 04/11/14 1459

## 2014-04-11 NOTE — Progress Notes (Signed)
  2D Echocardiogram has been performed.  [name] 04/11/2014, 3:36 PM

## 2014-04-11 NOTE — ED Notes (Signed)
MD at bedside. Neuro

## 2014-04-11 NOTE — Code Documentation (Signed)
When checking on patient she reports that she has a headache that is off and on.  Neuro assessment completed and unchanged.  Family reports that she has been having headaches for the past two weeks for which her nephew has been giving her Tylenol.  Patient reports that it comes and goes, but on reassessment she is not having a headache.  Dr. Leonel Ramsay paged and made aware of the headaches.  Will continue to assess and report per MD.  Bedside handoff with ED RN Velna Hatchet.

## 2014-04-11 NOTE — ED Notes (Signed)
Pt placed into gown and on monitor upon arrival to room from CT scan. Pt monitored by 12 lead, blood pressure, and pulse ox.

## 2014-04-11 NOTE — ED Notes (Signed)
Pts CBG 69 reported to nurse Velna Hatchet

## 2014-04-11 NOTE — ED Notes (Signed)
Bolus of 5 mg TPA COMPLETED AFTER 1 MIN

## 2014-04-12 ENCOUNTER — Inpatient Hospital Stay (HOSPITAL_COMMUNITY): Payer: Medicare Other

## 2014-04-12 LAB — LIPID PANEL
Cholesterol: 155 mg/dL (ref 0–200)
HDL: 81 mg/dL (ref >39)
LDL Cholesterol: 55 mg/dL (ref 0–99)
Total CHOL/HDL Ratio: 1.9 RATIO
Triglycerides: 95 mg/dL (ref <150)
VLDL: 19 mg/dL (ref 0–40)

## 2014-04-12 LAB — HEMOGLOBIN A1C
Hgb A1c MFr Bld: 5.4 % (ref <5.7)
MEAN PLASMA GLUCOSE: 108 mg/dL (ref <117)

## 2014-04-12 MED ORDER — ASPIRIN 81 MG PO CHEW
81.0000 mg | CHEWABLE_TABLET | Freq: Every day | ORAL | Status: DC
  Administered 2014-04-12: 81 mg via ORAL
  Filled 2014-04-12: qty 1

## 2014-04-12 NOTE — Progress Notes (Signed)
UR completed.  Nala Kachel, RN BSN M[facility] CCM Trauma/Neuro ICU Case Manager [phone]  

## 2014-04-12 NOTE — Progress Notes (Signed)
Stroke Team Progress Note  HISTORY Chief Complaint: Code stroke   HPI:  [name] is an 80 y.o. female with known dementia and HTN. She has a 24/7 caregiver who was with her at time of event. Patient had just had a bath at 1200 and appeared normal at that time. At 12:15 caregiver felt she was not speaking clearly or coherently. EMS was called and patient was brought to ED. On arrival patient had an aphasia, right upper quadrant field cut. Initially tPA was offered to son and son refused. However after speaking to patients PCP, the son then reconsidered and decided to go forward with giving tPA.  Date last known well: Date: 04/11/2014  Time last known well: Time: 12:00  NIHSS 5  IV tPA was administered at 2:14 pm, administration ws delayed due to patient's family needing time to consider risks vs benefits of treatment. She was admitted to the neuro ICU for further evaluation and treatment.  SUBJECTIVE No acute events overnight. Patient is alert and conversant.   OBJECTIVE Most recent Vital Signs: Filed Vitals:   04/12/14 0412 04/12/14 0500 04/12/14 0600 04/12/14 0700  BP:  156/65 123/62 123/48  Pulse:  56 55 46  Temp: 97.4 F (36.3 C)     TempSrc: Axillary     Resp:  16 15 24   Weight:      SpO2:  100% 100% 100%   CBG (last 3)   Recent Labs  04/11/14 1349  GLUCAP 69*    IV Fluid Intake:   . sodium chloride 75 mL/hr at 04/12/14 0700    MEDICATIONS  . Chlorhexidine Gluconate Cloth  6 each Topical Q0600  . mupirocin ointment  1 application Nasal BID  . pantoprazole (PROTONIX) IV  40 mg Intravenous QHS   PRN:  acetaminophen, acetaminophen, labetalol  Diet:  Dysphagia thin liquids Activity:  Bedrest DVT Prophylaxis:  None necessary yet, patient got tPA  CLINICALLY SIGNIFICANT STUDIES Basic Metabolic Panel:  Recent Labs Lab 04/11/14 1310 04/11/14 1317  NA 139 138  K 4.0 3.8  CL 102 103  CO2 24  --   GLUCOSE 90 88  BUN 19 19  CREATININE 1.09 1.20*   CALCIUM 12.0*  --    Liver Function Tests:  Recent Labs Lab 04/11/14 1310  AST 26  ALT 18  ALKPHOS 72  BILITOT 0.4  PROT 7.2  ALBUMIN 3.6   CBC:  Recent Labs Lab 04/11/14 1310 04/11/14 1317  WBC 6.4  --   NEUTROABS 3.9  --   HGB 12.7 14.3  HCT 38.5 42.0  MCV 81.6  --   PLT 246  --    Coagulation:  Recent Labs Lab 04/11/14 1310  LABPROT 12.7  INR 0.97   Cardiac Enzymes:  Recent Labs Lab 04/11/14 1850  CKTOTAL 43  CKMB 1.8   Urinalysis:  Recent Labs Lab 04/11/14 1507  COLORURINE YELLOW  LABSPEC 1.008  PHURINE 7.0  GLUCOSEU NEGATIVE  HGBUR NEGATIVE  BILIRUBINUR NEGATIVE  KETONESUR NEGATIVE  PROTEINUR NEGATIVE  UROBILINOGEN 1.0  NITRITE NEGATIVE  LEUKOCYTESUR NEGATIVE   Lipid Panel    Component Value Date/Time   CHOL 155 04/12/2014 0232   TRIG 95 04/12/2014 0232   HDL 81 04/12/2014 0232   CHOLHDL 1.9 04/12/2014 0232   VLDL 19 04/12/2014 0232   LDLCALC 55 04/12/2014 0232   HgbA1C  Lab Results  Component Value Date   HGBA1C 5.7* 04/20/2011    Urine Drug Screen:     Component Value Date/Time  LABOPIA NONE DETECTED 04/11/2014 1508   COCAINSCRNUR NONE DETECTED 04/11/2014 1508   LABBENZ NONE DETECTED 04/11/2014 1508   AMPHETMU NONE DETECTED 04/11/2014 1508   THCU NONE DETECTED 04/11/2014 1508   LABBARB NONE DETECTED 04/11/2014 1508    Alcohol Level:  Recent Labs Lab 04/11/14 1310  ETH <11    Ct Head Wo Contrast  04/11/2014   IMPRESSION: 1. No acute intracranial abnormality. 2. Moderate generalized atrophy and mild chronic microvascular ischemic changes of the white matter.    Dg Chest Port 1 View  04/11/2014   IMPRESSION: Vague nodular density overlying the left apex which measures 12 mm in dimension. This is suspicious for neoplasm and CT evaluation is recommended when clinically able.     MRI/A of the brain pending  2D Echocardiogram pending  Carotid Doppler pending  EKG  Sinus rhythm, borderline T abnormalities, inferior  leads  Therapy Recommendations pt on bedrest due to tPA protocol  Physical Exam Blood pressure 108/79, pulse 60, temperature 98.1 F (36.7 C), temperature source Oral, resp. rate 14, weight 54.9 kg (121 lb 0.5 oz), SpO2 97.00%. Gen: Patient is well developed elderly woman in no acute distress.  Cardiac: RRR. S1S2 audible. No M/R/G.  Extremities: Cap refill <2 secs. No cyanosis or edema. Pulses 2+ radial, DP. Pulmonary: Respirations regular, symmetric. Lungs clear to auscultation. Abd: Soft, non-tender. BS audible x 4 quadrants.  G/U: Deferred  MS: Alert, follows commands. Disoriented to time and event. Speech: Mild Anomia, circumlocutory speech. CN: No visual field cut to threat. PERRL. EOMs intact. Facial sensation intact V1-3. No facial droop. Hearing grossly intact. Strong cough. Sternocleidomastoids and trapezius 5/5 strength. Tongue midline, no atrophy or fasciculations. Strength: 5/5 in all four extremities proximally and distally. Sensation: Intact to light touch in all four extremities. Coordination: No ataxia or dysmetria on FTN or HTS bilat. Reflexes: 2+ biceps, brachioradialis bilat. 2+ patellar, achilles bilat. No clonus. Downgoing toes bilat.  NIHSS 3, but patient appears to be at her baseline and deficits appear to be due to dementia.   ASSESSMENT/PLAN [name] is a 80 y.o. female with history of hypertension and dementia presenting with aphasia and R upper quadrant field cut. Status post IV t-PA at 2:14 pm on 04/11/14, administration delayed to give family time to consider risks vs benefits of treatment. CT shows no acute intracranial process. MRI pending. Symptoms suggestive of infarct in L PCA territory, etiology as yet unknown. On aspirin 81 mg orally every day prior to admission. Holding aspirin for now pending repeat imaging results, will plan to restart tonight for secondary stroke prevention if no hemorrhage on imaging.  Patient with no resultant deficits.  Disorientation and anomia appear to be related to patient's baseline dementia. Stroke work up underway.  Stroke  On ASA at home, currently holding due to tPA administration, will restart if repeat imaging negative for hemorrhage.   MRI pending  Echo pending  Carotid US pending  LDL 55, no need for statin  HgbA1C pending  Hypertension  On Triamterene-HCTZ at home, currently holding for permissive hypertension, IV labetalol PRN to maintain SBP<180 and DBP<105   BP today 100s-150s/50s-70s  Dementia  Restarted home donepezil 5 mg and olanzapine 10 mg PO qHS   Hospital day # 1  SIGNED Delbert Phenix, MSN, ANP-C, CNRN, MSCS [employer] Stroke Team [phone]  I have personally obtained a history, examined the patient, evaluated imaging results, and formulated the assessment and plan of care. I agree with the above. This  patient is critically ill and at significant risk of neurological worsening, death and care requires constant monitoring of vital signs, hemodynamics,respiratory and cardiac monitoring,review of multiple databases, neurological assessment, discussion with family, other specialists and medical decision making of high complexity. I spent 30 minutes of neurocritical care time  in the care of  this patient. Antony Contras, MD  To contact Stroke Continuity provider, please refer to [email or web address] After hours, contact General Neurology

## 2014-04-12 NOTE — Evaluation (Signed)
Speech Language Pathology Evaluation Patient Details Name: [name] MRN: [id number] DOB: 03/21/1922 Today's Date: 04/12/2014 Time: 1040-1056 SLP Time Calculation (min): 16 min  Problem List:  Patient Active Problem List   Diagnosis Date Noted  . Stroke 04/11/2014   Past Medical History:  Past Medical History  Diagnosis Date  . Urinary tract infection, site not specified   . Hematuria, unspecified   . Dysuria   . Pain in joint, site unspecified   . Alzheimer's disease   . Essential hypertension, malignant   . Osteoarthrosis, unspecified whether generalized or localized, unspecified site   . Chronic kidney disease, stage II (mild)   . Unspecified vitamin D deficiency   . Hypertension    Past Surgical History: History reviewed. No pertinent past surgical history. HPI:  80 y.o. female with known dementia and HTN.  She has a 24/7 caregiver who was with her at time of event. On arrival patient had an aphasia, right upper quadrant field cut.  tPa administered.    Assessment / Plan / Recommendation Clinical Impression  Pt with baseline dementia admitted with acute onset aphasia; no caregivers available to identify baseline level of communication, however, pt repeatedly made comments like "I don't know why I can't think of the words" throughout evaluation.  Presents with fluent output with intact repetition; preserved abilty to follow familiar, one-step commands but increased errors with complexity/novelty; errors of anomia with general recognition of naming deficits.  Rec SLP f/u to maximize communication effectiveness; will establish baseline with family.      SLP Assessment  Patient needs continued Speech Lanaguage Pathology Services    Follow Up Recommendations   (tba pending OT/PT recs)    Frequency and Duration min 2x/week  1 week      SLP Goals  SLP Goals Potential to Achieve Goals: Good  SLP Evaluation Prior Functioning  Cognitive/Linguistic Baseline:  Information not available   Cognition  Overall Cognitive Status: No family/caregiver present to determine baseline cognitive functioning Arousal/Alertness: Awake/alert Orientation Level: Oriented to person;Disoriented to place;Disoriented to time;Disoriented to situation Attention: Sustained Sustained Attention: Appears intact    Comprehension  Auditory Comprehension Overall Auditory Comprehension: Impaired Yes/No Questions: Impaired Complex Questions: 25-49% accurate Commands: Impaired One Step Basic Commands: 75-100% accurate Conversation: Simple Visual Recognition/Discrimination Discrimination: Within Function Limits Reading Comprehension Reading Status: Within funtional limits    Expression Expression Primary Mode of Expression: Verbal Verbal Expression Overall Verbal Expression: Impaired Initiation: No impairment Automatic Speech: Social Response;Counting;Day of week Level of Generative/Spontaneous Verbalization: Conversation Repetition: No impairment Naming: Impairment Responsive: 76-100% accurate Confrontation: Impaired Convergent: 75-100% accurate Divergent: 50-74% accurate Pragmatics: No impairment Written Expression Written Expression: Not tested   Oral / Motor Oral Motor/Sensory Function Overall Oral Motor/Sensory Function: Appears within functional limits for tasks assessed Motor Speech Overall Motor Speech: Appears within functional limits for tasks assessed   GO     Juan Quam Laurice 04/12/2014, 11:18 AM

## 2014-04-12 NOTE — Progress Notes (Signed)
Bilateral carotid artery duplex:  1-39% ICA stenosis.  Vertebral artery flow is antegrade.     

## 2014-04-12 NOTE — Progress Notes (Signed)
PT Cancellation Note  Patient Details Name: [name] MRN: [id number] DOB: 1922-05-19   Cancelled Treatment:    Reason Eval/Treat Not Completed: Patient not medically ready.  Pt currently on strict bedrest and given tPA yesterday.  Please advise when pt becomes appropriate for therapy and mobility.  Thanks.     Catarina Hartshorn, [place] 04/12/2014, 7:22 AM

## 2014-04-12 NOTE — Progress Notes (Signed)
OT Cancellation Note  Patient Details Name: [name] MRN: [id number] DOB: June 04, 1922   Cancelled Treatment:    Reason Eval/Treat Not Completed: Patient not medically ready Pt on bedrest. Will eval when appropriate.  Hope, OTR/L  [phone] 04/12/2014 04/12/2014, 8:07 AM

## 2014-04-13 ENCOUNTER — Encounter (HOSPITAL_COMMUNITY): Payer: Self-pay

## 2014-04-13 ENCOUNTER — Inpatient Hospital Stay (HOSPITAL_COMMUNITY): Payer: Medicare Other

## 2014-04-13 MED ORDER — CLOPIDOGREL BISULFATE 75 MG PO TABS
75.0000 mg | ORAL_TABLET | Freq: Every day | ORAL | Status: DC
  Administered 2014-04-13: 75 mg via ORAL
  Filled 2014-04-13 (×2): qty 1

## 2014-04-13 MED ORDER — CHLORHEXIDINE GLUCONATE CLOTH 2 % EX PADS
6.0000 | MEDICATED_PAD | Freq: Every day | CUTANEOUS | Status: DC
Start: 2014-04-13 — End: 2014-11-01

## 2014-04-13 MED ORDER — CLOPIDOGREL BISULFATE 75 MG PO TABS: 75.0000 mg | ORAL_TABLET | Freq: Every day | ORAL | Status: DC

## 2014-04-13 MED ORDER — MUPIROCIN 2 % EX OINT: 1.0000 "application " | TOPICAL_OINTMENT | Freq: Two times a day (BID) | CUTANEOUS | Status: DC

## 2014-04-13 NOTE — Evaluation (Signed)
Occupational Therapy Evaluation Patient Details Name: [name] MRN: [id number] DOB: 1922-02-09 Today's Date: 04/13/2014    History of Present Illness 80 yo female admitted with aphasia and right upper quadrant field cut. Pt s/p TPA. PT admited to r/o CVA. hx of dementia and HTN   Clinical Impression   Patient evaluated by Occupational Therapy with no further acute OT needs identified. All education has been completed and the patient has no further questions. See below for any follow-up Occupational Therapy or equipment needs. OT to sign off. Thank you for referral.      Follow Up Recommendations  No OT follow up    Equipment Recommendations  None recommended by OT    Recommendations for Other Services       Precautions / Restrictions Precautions Precautions: Fall      Mobility Bed Mobility               General bed mobility comments: in bathroom on arrival  Transfers Overall transfer level: Needs assistance   Transfers: Sit to/from Stand Sit to Stand: Min guard              Balance                                            ADL                                         General ADL Comments: Pt complete toilet transfer Min (A). Pt and son completed LB dressing and don of brief. Pt transfering with son. Per son pt is at or near baseline.      Vision                     Perception     Praxis      Pertinent Vitals/Pain VSS     Hand Dominance Right   Extremity/Trunk Assessment Upper Extremity Assessment Upper Extremity Assessment: Overall WFL for tasks assessed   Lower Extremity Assessment Lower Extremity Assessment: Defer to PT evaluation   Cervical / Trunk Assessment Cervical / Trunk Assessment: Kyphotic   Communication Communication Communication: No difficulties   Cognition Arousal/Alertness: Awake/alert Behavior During Therapy: WFL for tasks assessed/performed Overall Cognitive  Status: History of cognitive impairments - at baseline                     General Comments       Exercises       Shoulder Instructions      Home Living Family/patient expects to be discharged to:: Private residence Living Arrangements: Children Available Help at Discharge: Personal care attendant;Available 24 hours/day Type of Home: House Home Access: Other (comment)     Home Layout: One level               Home Equipment: Cane - single point;Walker - standard;Bedside commode;Shower seat;Grab bars - tub/shower;Wheelchair - manual   Additional Comments: pt has son and hired caregiver at baseline.       Prior Functioning/Environment Level of Independence: Independent with assistive device(s)             OT Diagnosis:     OT Problem List:     OT Treatment/Interventions:      OT Goals(Current  goals can be found in the care plan section)    OT Frequency:     Barriers to D/C:            Co-evaluation              End of Session Nurse Communication: Mobility status  Activity Tolerance: Patient tolerated treatment well Patient left: in chair;with call bell/phone within reach;with family/visitor present   Time: 1350-1402 OT Time Calculation (min): 12 min Charges:  OT General Charges $OT Visit: 1 Procedure OT Evaluation $Initial OT Evaluation Tier I: 1 Procedure OT Treatments $Self Care/Home Management : 8-22 mins G-Codes:    Peri Maris 2014-04-15, 2:22 PM Pager: [phone]

## 2014-04-13 NOTE — Discharge Summary (Addendum)
Stroke Discharge Summary  Patient ID: Brianna ofarrell   MRN: [id number]      DOB: 16-May-1922  Date of Admission: 04/11/2014 Date of Discharge: 04/13/2014  Attending Physician:  Suzzanne Cloud, MD, Stroke MD  Consulting Physician(s):     None  Patient's PCP:  Kevan Ny, MD  Discharge Diagnoses:  Active Problems:   Stroke  BMI: Body mass index is 20.76 kg/(m^2). Hypertension history - allow permissive hypertension in the setting of an acute stroke. Mildly elevated creatinine - mild chronic kidney disease - stage II Positive MRSA nasopharyngeal screen Status post t-PA therapy for acute stroke History of dementia Left lung nodule noted on chest x-ray with followup recommended ( see below )   Past Medical History  Diagnosis Date  . Urinary tract infection, site not specified   . Hematuria, unspecified   . Dysuria   . Pain in joint, site unspecified   . Alzheimer's disease   . Essential hypertension, malignant   . Osteoarthrosis, unspecified whether generalized or localized, unspecified site   . Chronic kidney disease, stage II (mild)   . Unspecified vitamin D deficiency   . Hypertension    History reviewed. No pertinent past surgical history.    Medication List    STOP taking these medications       aspirin 81 MG tablet     triamterene-hydrochlorothiazide 37.5-25 MG per tablet  Commonly known as:  MAXZIDE-25      TAKE these medications       acetaminophen 500 MG tablet  Commonly known as:  TYLENOL  Take 500 mg by mouth every 6 (six) hours as needed for mild pain.     Chlorhexidine Gluconate Cloth 2 % Pads  Apply 6 each topically daily at 6 (six) AM.     clopidogrel 75 MG tablet  Commonly known as:  PLAVIX  Take 1 tablet (75 mg total) by mouth daily with breakfast.     donepezil 5 MG tablet  Commonly known as:  ARICEPT  Take 5 mg by mouth at bedtime.     Fish Oil 1200 MG Caps  Take 1 capsule by mouth every other day.     loratadine 10 MG  tablet  Commonly known as:  CLARITIN  Take 10 mg by mouth daily.     multivitamin tablet  Take 1 tablet by mouth daily.     mupirocin ointment 2 %  Commonly known as:  BACTROBAN  Place 1 application into the nose 2 (two) times daily.     nitroGLYCERIN 0.4 MG SL tablet  Commonly known as:  NITROSTAT  Place 0.4 mg under the tongue every 5 (five) minutes as needed for chest pain.     OLANZapine 10 MG tablet  Commonly known as:  ZYPREXA  Take 10 mg by mouth at bedtime.        LABORATORY STUDIES CBC    Component Value Date/Time   WBC 6.4 04/11/2014 1310   RBC 4.72 04/11/2014 1310   HGB 14.3 04/11/2014 1317   HCT 42.0 04/11/2014 1317   PLT 246 04/11/2014 1310   MCV 81.6 04/11/2014 1310   MCH 26.9 04/11/2014 1310   MCHC 33.0 04/11/2014 1310   RDW 13.8 04/11/2014 1310   LYMPHSABS 1.9 04/11/2014 1310   MONOABS 0.5 04/11/2014 1310   EOSABS 0.1 04/11/2014 1310   BASOSABS 0.0 04/11/2014 1310   CMP    Component Value Date/Time   NA 138 04/11/2014 1317   K 3.8 04/11/2014  1317   CL 103 04/11/2014 1317   CO2 24 04/11/2014 1310   GLUCOSE 88 04/11/2014 1317   BUN 19 04/11/2014 1317   CREATININE 1.20* 04/11/2014 1317   CALCIUM 12.0* 04/11/2014 1310   CALCIUM 9.7 08/15/2011 1550   PROT 7.2 04/11/2014 1310   ALBUMIN 3.6 04/11/2014 1310   AST 26 04/11/2014 1310   ALT 18 04/11/2014 1310   ALKPHOS 72 04/11/2014 1310   BILITOT 0.4 04/11/2014 1310   GFRNONAA 43* 04/11/2014 1310   GFRAA 50* 04/11/2014 1310   COAGS Lab Results  Component Value Date   INR 0.97 04/11/2014   Lipid Panel    Component Value Date/Time   CHOL 155 04/12/2014 0232   TRIG 95 04/12/2014 0232   HDL 81 04/12/2014 0232   CHOLHDL 1.9 04/12/2014 0232   VLDL 19 04/12/2014 0232   LDLCALC 55 04/12/2014 0232   HgbA1C  Lab Results  Component Value Date   HGBA1C 5.4 04/12/2014   Cardiac Panel (last 3 results)  Recent Labs  04/11/14 1850  CKTOTAL 43  CKMB 1.8  RELINDX RELATIVE INDEX IS INVALID   Urinalysis    Component Value  Date/Time   COLORURINE YELLOW 04/11/2014 1507   APPEARANCEUR CLEAR 04/11/2014 1507   LABSPEC 1.008 04/11/2014 1507   PHURINE 7.0 04/11/2014 1507   GLUCOSEU NEGATIVE 04/11/2014 1507   HGBUR NEGATIVE 04/11/2014 1507   BILIRUBINUR NEGATIVE 04/11/2014 1507   KETONESUR NEGATIVE 04/11/2014 1507   PROTEINUR NEGATIVE 04/11/2014 1507   UROBILINOGEN 1.0 04/11/2014 1507   NITRITE NEGATIVE 04/11/2014 1507   LEUKOCYTESUR NEGATIVE 04/11/2014 1507   Urine Drug Screen    Component Value Date/Time   LABOPIA NONE DETECTED 04/11/2014 1508   COCAINSCRNUR NONE DETECTED 04/11/2014 1508   LABBENZ NONE DETECTED 04/11/2014 1508   AMPHETMU NONE DETECTED 04/11/2014 1508   THCU NONE DETECTED 04/11/2014 1508   LABBARB NONE DETECTED 04/11/2014 1508    Alcohol Level    Component Value Date/Time   ETH <11 04/11/2014 1310     SIGNIFICANT DIAGNOSTIC STUDIES  Ct Head Wo Contrast  04/11/2014 IMPRESSION: 1. No acute intracranial abnormality. 2. Moderate generalized atrophy and mild chronic microvascular ischemic changes of the white matter.   Dg Chest Port 1 View  04/11/2014 IMPRESSION: Vague nodular density overlying the left apex which measures 12 mm in dimension. This is suspicious for neoplasm and CT evaluation is recommended when clinically able. (See below)  MRI/A of the brain no acute infarct. Generalized atrophy   2D Echocardiogram ; EF 55-60 %. No cardiac source of embolism   Carotid Doppler no significant extracranial stenosis bilaterally.   EKG Sinus rhythm, borderline T abnormalities, inferior leads      History of Present Illness    [name] is a 80 y.o. female with known dementia, HTN, and chronic kidney disease. She has a 24/7 caregiver who was with her at time of event. Patient had just had a bath at 1200 on 04/11/2014 and appeared normal at that time. At 12:15 caregiver felt she was not speaking clearly or coherently. EMS was called and patient was brought to ED. On arrival patient had an aphasia  and a right upper quadrant field cut. Initially tPA was offered to son and son refused. However after speaking to patients PCP, the son then reconsidered and decided to go forward with giving tPA.   Date last known well: Date: 04/11/2014  Time last known well: Time: 12:00  NIHSS 5   IV tPA was administered at  2:14 pm, administration ws delayed due to patient's family needing time to consider risks vs benefits of treatment. She was admitted to the neuro ICU for further evaluation and treatment.  Hospital Course  Following administration of the intravenous TPA the patient was admitted to the neuro intensive care unit for close monitoring. She was seen the following day by Dr. Leonie Man on rounds at which time she was noted to be alert and conversant. She remained confused consistent with her history of dementia. Most of the stroke workup studies were pending at that time. The patient had been on triamterene - hydrochlorothiazide prior to admission; however, this was held for permissive hypertension in the setting of an acute stroke. She was evaluated by the speech, occupational, and physical therapists. She was cleared for a dysphagia 2 diet with thin liquids. Home health PT and OT were recommended after discharge. An MRI of the head showed no acute infarct. It was felt that possibly the infarct was small and was missed by the MRI. The remainder of the stroke workup was unremarkable. Dr. Leonie Man recommended discontinuation of aspirin 81 mg daily as taken prior to admission and initiation of Plavix 75 mg daily. The patient was seen by Dr. Leonie Man on 04/13/2014 and felt to be stable for discharge.   The patient's chest x-ray on admission noted a possible left lung nodule and a followup CT evaluation was recommended. I spoke with the patient's son who is here to take the patient home. The CT of the chest will be performed today prior to discharge with results pending. (See below)  Patient with vascular risk factors  of:   Hypertension   Patient also with:  Mild chronic kidney disease  Dementia  Abnormal chest x-ray with followup chest CT pending  Osteoporosis   Patient with resolution of deficits. Physical therapy, occupational therapy and speech therapy evaluated patient. They recommend home health physical and occupational therapies. Dysphagia 2 diet.  Discharge Exam  Blood pressure 142/44, pulse 50, temperature 97.9 F (36.6 C), temperature source Oral, resp. rate 21, height 5\' 4"  (1.626 m), weight 121 lb 0.5 oz (54.9 kg), SpO2 100.00%.  Physical Exam  Blood pressure 142/44, pulse 50, temperature 97.4 F (36.3 C), temperature source Oral, resp. rate 21, height 5\' 4"  (1.626 m), weight 121 lb 0.5 oz (54.9 kg), SpO2 100.00%.  Gen: Patient is well developed elderly woman in no acute distress.  Cardiac: RRR. S1S2 audible. No M/R/G.  Extremities: Cap refill <2 secs. No cyanosis or edema. Pulses 2+ radial, DP.  Pulmonary: Respirations regular, symmetric. Lungs clear to auscultation.  Abd: Soft, non-tender. BS audible x 4 quadrants.  G/U: Deferred  MS: Alert, follows commands. Disoriented to time and event. Speech: Mild Anomia, circumlocutory speech. CN: No visual field cut to threat. PERRL. EOMs intact. Facial sensation intact V1-3. No facial droop. Hearing grossly intact. Strong cough. Sternocleidomastoids and trapezius 5/5 strength. Tongue midline, no atrophy or fasciculations. Strength: 5/5 in all four extremities proximally and distally. Sensation: Intact to light touch in all four extremities. Coordination: No ataxia or dysmetria on FTN or HTS bilat. Reflexes: 2+ biceps, brachioradialis bilat. 2+ patellar, achilles bilat. No clonus. Downgoing toes bilat.  NIHSS 3, but patient appears to be at her baseline and deficits appear to be due to dementia.   Discharge Diet   Dysphagia II diet with liquids  Discharge Plan    Disposition:  Discharged home to the care of her son and  family.  clopidogrel 75 mg orally every day  for secondary stroke prevention.  Ongoing risk factor control by Primary Care Physician. Risk factor recommendations:  Hypertension target range 130-140/70-80   Follow-up DEAN, ERIC, MD in 2 weeks.  Follow-up with Dr. Erlinda Hong in the Stroke Clinic in 2 months.  Followup chest CT to be performed 04/13/2014 to evaluate nodule noted on chest x-ray. See below.  Resume triamterene hydrochlorothiazide for blood pressure when appropriate.  Greater than 30 minutes were spent preparing discharge.  Signed Mikey Bussing PA-C [facility] Pager [phone] 04/13/2014, 1:53 PM   I have personally examined this patient, reviewed pertinent data and developed the plan of care. I agree with above. Antony Contras, MD   Chest CT  04/13/2014 1. 2.3 x 3.1 cm spiculated mass right breast. This is suspicious for right breast cancer. Correlation with mammography suggested. 2. Previously identified questionable nodule left upper lobe not identified by chest CT. This most likely represented overlapping shadows on chest x-ray. Mild bibasilar atelectasis present. 3. Coronary artery disease.  The patient was discharged home prior to the reading of the chest CT. I have sent a message to the patient's primary care physician Dr. Kevan Ny and have attempted to reach the family at home without success. I will continue trying to contact the family.  Mikey Bussing PA-C [facility] Pager [phone] 04/14/2014, 10:58 AM

## 2014-04-13 NOTE — Evaluation (Addendum)
Physical Therapy Evaluation Patient Details Name: [name] MRN: [id number] DOB: 04-17-1922 Today's Date: 04/13/2014   History of Present Illness  [name] is a 80 y.o. female with history of hypertension and dementia presenting with aphasia and R upper quadrant field cut. Status post IV t-PA at 2:14 pm on 04/11/14, administration delayed to give family time to consider risks vs benefits of treatment. CT shows no acute intracranial process. MRI negative for acute infarct suspect left brain TIA etiology as yet unknown possibly cardioembolic but she is not anticoagulation candidate given dementia hence will not pursue TEE and long term monitoring. On aspirin 81 mg orally every day prior to admission. On Plavix for secondary stroke prevention . Patient with no resultant deficits. Disorientation and anomia appear to be related to patient's baseline dementia.    Clinical Impression  Pt admitted with above. Pt currently with functional limitations due to the deficits listed below (see PT Problem List).  Pt will benefit from skilled PT to increase their independence and safety with mobility to allow discharge to the venue listed below.     Follow Up Recommendations Home health PT;Supervision/Assistance - 24 hour    Equipment Recommendations  Rolling walker with 5" wheels    Recommendations for Other Services       Precautions / Restrictions Precautions Precautions: Fall Restrictions Weight Bearing Restrictions: No      Mobility  Bed Mobility Overal bed mobility: Needs Assistance Bed Mobility: Supine to Sit     Supine to sit: Min assist     General bed mobility comments: takes incr time  Transfers Overall transfer level: Needs assistance Equipment used: Rolling walker (2 wheeled) Transfers: Sit to/from Stand Sit to Stand: Min guard         General transfer comment: Needed cues for hand placement and steadying assist.   Ambulation/Gait Ambulation/Gait  assistance: Min assist Ambulation Distance (Feet): 76 Feet Assistive device: Rolling walker (2 wheeled);1 person hand held assist Gait Pattern/deviations: Step-to pattern;Decreased stride length;Narrow base of support;Staggering left;Staggering right   Gait velocity interpretation: Below normal speed for age/gender General Gait Details: Pt unsteady without UE support.  Did a little better with 1 UE support but still staggers occasionally.  Did best with RW but still needs 24 hour assist as she is still unsteady at times with RW due to confusion.    Stairs            Wheelchair Mobility    Modified Rankin (Stroke Patients Only) Modified Rankin (Stroke Patients Only) Pre-Morbid Rankin Score: Moderate disability Modified Rankin: Moderately severe disability     Balance Overall balance assessment: Needs assistance;History of Falls Sitting-balance support: No upper extremity supported;Feet supported Sitting balance-Leahy Scale: Fair     Standing balance support: Bilateral upper extremity supported;During functional activity Standing balance-Leahy Scale: Poor Standing balance comment: Has to have UE support to stand.                               Pertinent Vitals/Pain BP 142/44 pre OOB.  98/44 after getting in chair.      Home Living Family/patient expects to be discharged to:: Private residence Living Arrangements: Children Available Help at Discharge: Personal care attendant;Available 24 hours/day Type of Home: House Home Access: Other (comment) (Pt could not state)     Home Layout: One level Home Equipment: Cane - single point;Walker - standard;Bedside commode;Shower seat;Grab bars - tub/shower;Wheelchair - manual Additional Comments:  Pt unable to answer questions secondary to confusion.  Did state she used RW.  Chart states she has a 24 hour caregiver. Called son and he gave info re: equipment at home.  He states she was using a cane recently instead of  walker but that her walker has no wheels.  He confirmed that caregiver there 24 hours.       Prior Function Level of Independence: Independent with assistive device(s)         Comments: States she used RW at all times at home.      Hand Dominance        Extremity/Trunk Assessment   Upper Extremity Assessment: Defer to OT evaluation           Lower Extremity Assessment: Generalized weakness      Cervical / Trunk Assessment: Kyphotic  Communication   Communication: Expressive difficulties  Cognition Arousal/Alertness: Awake/alert Behavior During Therapy: Flat affect Overall Cognitive Status: No family/caregiver present to determine baseline cognitive functioning       Memory: Decreased short-term memory              General Comments      Exercises General Exercises - Lower Extremity Ankle Circles/Pumps: AROM;Both;5 reps;Supine Quad Sets: AROM;Both;10 reps;Supine Long Arc Quad: AROM;Both;5 reps;Seated      Assessment/Plan    PT Assessment Patient needs continued PT services  PT Diagnosis Generalized weakness   PT Problem List Decreased activity tolerance;Decreased balance;Decreased mobility;Decreased knowledge of use of DME;Decreased safety awareness;Decreased knowledge of precautions  PT Treatment Interventions DME instruction;Gait training;Functional mobility training;Therapeutic activities;Therapeutic exercise;Balance training;Patient/family education   PT Goals (Current goals can be found in the Care Plan section) Acute Rehab PT Goals Patient Stated Goal: to go home PT Goal Formulation: With patient Time For Goal Achievement: 04/20/14 Potential to Achieve Goals: Good    Frequency Min 4X/week   Barriers to discharge        Co-evaluation               End of Session Equipment Utilized During Treatment: Gait belt Activity Tolerance: Patient limited by fatigue Patient left: in chair;with call bell/phone within reach;with restraints  reapplied - posey belt Nurse Communication: Mobility status         Time: 3614-4315 PT Time Calculation (min): 18 min   Charges:   PT Evaluation $Initial PT Evaluation Tier I: 1 Procedure PT Treatments $Gait Training: 8-22 mins   PT G Codes:          INGOLD,[name] 05/07/14, 10:18 AM [name] Acute Rehabilitation [phone] (pager)

## 2014-04-13 NOTE — Discharge Instructions (Signed)
Home health physical and occupational therapies will be arranged. Soft diet Followup with Dr.Dean in 2 weeks for evaluation of blood pressure - currently off blood pressure medication. The patient needs a followup chest CT for evaluation of a possible small left lung nodule noted on chest x-ray at time of admission. This could be arranged through Dr. Randel Pigg office.

## 2014-04-13 NOTE — Progress Notes (Signed)
Stroke Team Progress Note  HISTORY Chief Complaint: Code stroke   HPI:  [name] is an 80 y.o. female with known dementia and HTN. She has a 24/7 caregiver who was with her at time of event. Patient had just had a bath at 1200 and appeared normal at that time. At 12:15 caregiver felt she was not speaking clearly or coherently. EMS was called and patient was brought to ED. On arrival patient had an aphasia, right upper quadrant field cut. Initially tPA was offered to son and son refused. However after speaking to patients PCP, the son then reconsidered and decided to go forward with giving tPA.  Date last known well: Date: 04/11/2014  Time last known well: Time: 12:00  NIHSS 5  IV tPA was administered at 2:14 pm, administration ws delayed due to patient's family needing time to consider risks vs benefits of treatment. She was admitted to the neuro ICU for further evaluation and treatment.  SUBJECTIVE No acute events overnight. Patient is alert and conversant sitting up in bedside chair and eating breakfast.She lives with son and has caregiver at home from 9-5 MRI shows no acute infarct.   OBJECTIVE Most recent Vital Signs: Filed Vitals:   04/13/14 0600 04/13/14 0700 04/13/14 0754 04/13/14 0800  BP: 129/75 109/43  142/44  Pulse: 68 56  50  Temp:   97.4 F (36.3 C)   TempSrc:   Oral   Resp: 12 14  21   Height:      Weight:      SpO2: 100% 100%  100%   CBG (last 3)   Recent Labs  04/11/14 1349  GLUCAP 69*    IV Fluid Intake:   . sodium chloride 75 mL/hr at 04/13/14 0900    MEDICATIONS  . aspirin  81 mg Oral Daily  . Chlorhexidine Gluconate Cloth  6 each Topical Q0600  . mupirocin ointment  1 application Nasal BID  . pantoprazole (PROTONIX) IV  40 mg Intravenous QHS   PRN:  acetaminophen, acetaminophen, labetalol  Diet:  Dysphagia thin liquids Activity:  Out of bed as tolearted DVT Prophylaxis:  Activity as tolerated.  CLINICALLY SIGNIFICANT STUDIES Basic  Metabolic Panel:   Recent Labs Lab 04/11/14 1310 04/11/14 1317  NA 139 138  K 4.0 3.8  CL 102 103  CO2 24  --   GLUCOSE 90 88  BUN 19 19  CREATININE 1.09 1.20*  CALCIUM 12.0*  --    Liver Function Tests:   Recent Labs Lab 04/11/14 1310  AST 26  ALT 18  ALKPHOS 72  BILITOT 0.4  PROT 7.2  ALBUMIN 3.6   CBC:   Recent Labs Lab 04/11/14 1310 04/11/14 1317  WBC 6.4  --   NEUTROABS 3.9  --   HGB 12.7 14.3  HCT 38.5 42.0  MCV 81.6  --   PLT 246  --    Coagulation:   Recent Labs Lab 04/11/14 1310  LABPROT 12.7  INR 0.97   Cardiac Enzymes:   Recent Labs Lab 04/11/14 1850  CKTOTAL 43  CKMB 1.8   Urinalysis:   Recent Labs Lab 04/11/14 1507  COLORURINE YELLOW  LABSPEC 1.008  PHURINE 7.0  GLUCOSEU NEGATIVE  HGBUR NEGATIVE  BILIRUBINUR NEGATIVE  KETONESUR NEGATIVE  PROTEINUR NEGATIVE  UROBILINOGEN 1.0  NITRITE NEGATIVE  LEUKOCYTESUR NEGATIVE   Lipid Panel    Component Value Date/Time   CHOL 155 04/12/2014 0232   TRIG 95 04/12/2014 0232   HDL 81 04/12/2014 0232  CHOLHDL 1.9 04/12/2014 0232   VLDL 19 04/12/2014 0232   LDLCALC 55 04/12/2014 0232   HgbA1C  Lab Results  Component Value Date   HGBA1C 5.4 04/12/2014    Urine Drug Screen:     Component Value Date/Time   LABOPIA NONE DETECTED 04/11/2014 1508   COCAINSCRNUR NONE DETECTED 04/11/2014 1508   LABBENZ NONE DETECTED 04/11/2014 1508   AMPHETMU NONE DETECTED 04/11/2014 1508   THCU NONE DETECTED 04/11/2014 1508   LABBARB NONE DETECTED 04/11/2014 1508    Alcohol Level:   Recent Labs Lab 04/11/14 1310  ETH <11    Ct Head Wo Contrast  04/11/2014   IMPRESSION: 1. No acute intracranial abnormality. 2. Moderate generalized atrophy and mild chronic microvascular ischemic changes of the white matter.    Dg Chest Port 1 View  04/11/2014   IMPRESSION: Vague nodular density overlying the left apex which measures 12 mm in dimension. This is suspicious for neoplasm and CT evaluation is recommended  when clinically able.     MRI/A of the brain no acute infarct. Generalized atrophy  2D Echocardiogram  ; EF 55-60 %. No cardiac source of embolism  Carotid Doppler no significant extracranial stenosis bilaterally.  EKG  Sinus rhythm, borderline T abnormalities, inferior leads  Therapy Recommendations oob as tolerated. Walked with PT today  Physical Exam Blood pressure 142/44, pulse 50, temperature 97.4 F (36.3 C), temperature source Oral, resp. rate 21, height 5\' 4"  (1.626 m), weight 121 lb 0.5 oz (54.9 kg), SpO2 100.00%. Gen: Patient is well developed elderly woman in no acute distress.  Cardiac: RRR. S1S2 audible. No M/R/G.  Extremities: Cap refill <2 secs. No cyanosis or edema. Pulses 2+ radial, DP. Pulmonary: Respirations regular, symmetric. Lungs clear to auscultation. Abd: Soft, non-tender. BS audible x 4 quadrants.  G/U: Deferred  MS: Alert, follows commands. Disoriented to time and event. Speech: Mild Anomia, circumlocutory speech. CN: No visual field cut to threat. PERRL. EOMs intact. Facial sensation intact V1-3. No facial droop. Hearing grossly intact. Strong cough. Sternocleidomastoids and trapezius 5/5 strength. Tongue midline, no atrophy or fasciculations. Strength: 5/5 in all four extremities proximally and distally. Sensation: Intact to light touch in all four extremities. Coordination: No ataxia or dysmetria on FTN or HTS bilat. Reflexes: 2+ biceps, brachioradialis bilat. 2+ patellar, achilles bilat. No clonus. Downgoing toes bilat.  NIHSS 3, but patient appears to be at her baseline and deficits appear to be due to dementia.   ASSESSMENT/PLAN [name] is a 80 y.o. female with history of hypertension and dementia presenting with aphasia and R upper quadrant field cut. Status post IV t-PA at 2:14 pm on 04/11/14, administration delayed to give family time to consider risks vs benefits of treatment. CT shows no acute intracranial process. MRI negative for acute  infarct suspect left brain TIA etiology as yet unknown possibly cardioembolic but she is not anticoagulation candidate given dementia hence will not pursue TEE and long term monitoring. On aspirin 81 mg orally every day prior to admission.  On Plavix now tonight for secondary stroke prevention  .  Patient with no resultant deficits. Disorientation and anomia appear to be related to patient's baseline dementia. Stroke work up underway.  Stroke  On ASA at home, now changed to Plavix  MRI  No acute infarct  Echo and dopplers unremarkable pending  LDL 55, no need for statin  HgbA1C 5.5  Patient back to baseline as per therapists hence will DC home later if acceptable to family as  she has 24 hr care at home  Hypertension  On Triamterene-HCTZ at home, currently holding for permissive hypertension, IV labetalol PRN to maintain SBP<180 and DBP<105   BP today 100s-150s/50s-70s  Dementia  Restarted home donepezil 5 mg and olanzapine 10 mg PO qHS   Hospital day # 2  SIGNED  Antony Contras, MD  To contact Stroke Continuity provider, please refer to [email or web address] After hours, contact General Neurology

## 2014-04-13 NOTE — Care Management Note (Signed)
CARE MANAGEMENT NOTE 04/13/2014  Patient:  [name], [name]   Account Number:  [id number]  Date Initiated:  04/12/2014  Documentation initiated by:  Sandi Mariscal  Subjective/Objective Assessment:   stroke; had tPA     Action/Plan:   home with health services   Anticipated DC Date:  04/13/2014   Anticipated DC Plan:  [place]  CM consult      [facility] Choice  [place]   Choice offered to / List presented to:  C-4 Adult Children   DME arranged  [place]      DME agency  [place] arranged  [place].   Status of service:  Completed, signed off Medicare Important Message given?   (If response is "NO", the following Medicare IM given date fields will be blank) Date Medicare IM given:   Date Additional Medicare IM given:    Discharge Disposition:  [place]  Per UR Regulation:  Reviewed for med. necessity/level of care/duration of stay  If discussed at [place] of Stay Meetings, dates discussed:    Comments:  04/13/14--Nhyira Leano [facility] [phone] Received call from patient's nurse that patient will need home health PT/OT services prior to discharge. Made aware that patient's son is primary contact, Annia Belt [phone] for home health choice. Spoke with [name], offered choice and [facility] was chosen. Made Kristen with [facility] aware of referral for PT/OT services. Spoke with [facility] DME as well to make aware that order was placed for walker and will preferrably need it by 1 pm today as patient's family will be here to pick her up for discharge at that time. Made patient's nurse aware of the above. Elsie Ra, [place]

## 2014-05-06 ENCOUNTER — Telehealth (INDEPENDENT_AMBULATORY_CARE_PROVIDER_SITE_OTHER): Payer: Self-pay

## 2014-05-06 ENCOUNTER — Encounter (INDEPENDENT_AMBULATORY_CARE_PROVIDER_SITE_OTHER): Payer: Medicare Other | Admitting: General Surgery

## 2014-05-06 NOTE — Telephone Encounter (Signed)
LM on son's VM.  Pt was a no show for her appt today.  Also will make [facility] aware.

## 2014-06-06 ENCOUNTER — Encounter (INDEPENDENT_AMBULATORY_CARE_PROVIDER_SITE_OTHER): Payer: Self-pay

## 2014-11-01 ENCOUNTER — Emergency Department (HOSPITAL_COMMUNITY): Payer: Medicare Other

## 2014-11-01 ENCOUNTER — Emergency Department (HOSPITAL_COMMUNITY)
Admission: EM | Admit: 2014-11-01 | Discharge: 2014-11-01 | Disposition: A | Discharge status: Home / Self Care | Payer: Medicare Other | Attending: Emergency Medicine | Admitting: Emergency Medicine

## 2014-11-01 ENCOUNTER — Encounter (HOSPITAL_COMMUNITY): Payer: Self-pay | Admitting: Emergency Medicine

## 2014-11-01 DIAGNOSIS — Z7902 Long term (current) use of antithrombotics/antiplatelets: Secondary | ICD-10-CM | POA: Insufficient documentation

## 2014-11-01 DIAGNOSIS — Z8639 Personal history of other endocrine, nutritional and metabolic disease: Secondary | ICD-10-CM | POA: Insufficient documentation

## 2014-11-01 DIAGNOSIS — Z87891 Personal history of nicotine dependence: Secondary | ICD-10-CM | POA: Insufficient documentation

## 2014-11-01 DIAGNOSIS — Z87440 Personal history of urinary (tract) infections: Secondary | ICD-10-CM | POA: Insufficient documentation

## 2014-11-01 DIAGNOSIS — Z79899 Other long term (current) drug therapy: Secondary | ICD-10-CM | POA: Insufficient documentation

## 2014-11-01 DIAGNOSIS — G309 Alzheimer's disease, unspecified: Secondary | ICD-10-CM | POA: Insufficient documentation

## 2014-11-01 DIAGNOSIS — Z880 Allergy status to penicillin: Secondary | ICD-10-CM | POA: Insufficient documentation

## 2014-11-01 DIAGNOSIS — R531 Weakness: Secondary | ICD-10-CM

## 2014-11-01 DIAGNOSIS — G459 Transient cerebral ischemic attack, unspecified: Secondary | ICD-10-CM | POA: Insufficient documentation

## 2014-11-01 DIAGNOSIS — F0280 Dementia in other diseases classified elsewhere without behavioral disturbance: Secondary | ICD-10-CM | POA: Insufficient documentation

## 2014-11-01 DIAGNOSIS — I129 Hypertensive chronic kidney disease with stage 1 through stage 4 chronic kidney disease, or unspecified chronic kidney disease: Secondary | ICD-10-CM | POA: Insufficient documentation

## 2014-11-01 DIAGNOSIS — M1990 Unspecified osteoarthritis, unspecified site: Secondary | ICD-10-CM | POA: Insufficient documentation

## 2014-11-01 DIAGNOSIS — N182 Chronic kidney disease, stage 2 (mild): Secondary | ICD-10-CM | POA: Insufficient documentation

## 2014-11-01 HISTORY — DX: Cerebral infarction, unspecified: I63.9

## 2014-11-01 LAB — URINALYSIS, ROUTINE W REFLEX MICROSCOPIC
Glucose, UA: NEGATIVE mg/dL
HGB URINE DIPSTICK: NEGATIVE
Ketones, ur: 40 mg/dL — AB
LEUKOCYTES UA: NEGATIVE
Nitrite: NEGATIVE
Protein, ur: NEGATIVE mg/dL
Specific Gravity, Urine: 1.022 (ref 1.005–1.030)
Urobilinogen, UA: 1 mg/dL (ref 0.0–1.0)
pH: 5.5 (ref 5.0–8.0)

## 2014-11-01 LAB — COMPREHENSIVE METABOLIC PANEL
ALK PHOS: 72 U/L (ref 39–117)
ALT: 13 U/L (ref 0–35)
AST: 41 U/L — ABNORMAL HIGH (ref 0–37)
Albumin: 3.8 g/dL (ref 3.5–5.2)
Anion gap: 20 — ABNORMAL HIGH (ref 5–15)
BILIRUBIN TOTAL: 0.9 mg/dL (ref 0.3–1.2)
BUN: 14 mg/dL (ref 6–23)
CO2: 18 mmol/L — AB (ref 19–32)
Calcium: 12.8 mg/dL — ABNORMAL HIGH (ref 8.4–10.5)
Chloride: 97 mEq/L (ref 96–112)
Creatinine, Ser: 1.08 mg/dL (ref 0.50–1.10)
GFR calc Af Amer: 51 mL/min — ABNORMAL LOW (ref >90)
GFR, EST NON AFRICAN AMERICAN: 44 mL/min — AB (ref >90)
Glucose, Bld: 82 mg/dL (ref 70–99)
POTASSIUM: 4.4 mmol/L (ref 3.5–5.1)
Sodium: 135 mmol/L (ref 135–145)
Total Protein: 7.7 g/dL (ref 6.0–8.3)

## 2014-11-01 LAB — CBC WITH DIFFERENTIAL/PLATELET
BASOS ABS: 0 10*3/uL (ref 0.0–0.1)
BASOS PCT: 0 % (ref 0–1)
EOS PCT: 0 % (ref 0–5)
Eosinophils Absolute: 0 10*3/uL (ref 0.0–0.7)
HCT: 47.2 % — ABNORMAL HIGH (ref 36.0–46.0)
Hemoglobin: 16 g/dL — ABNORMAL HIGH (ref 12.0–15.0)
LYMPHS PCT: 17 % (ref 12–46)
Lymphs Abs: 1.2 10*3/uL (ref 0.7–4.0)
MCH: 27.4 pg (ref 26.0–34.0)
MCHC: 33.9 g/dL (ref 30.0–36.0)
MCV: 80.8 fL (ref 78.0–100.0)
MONO ABS: 0.4 10*3/uL (ref 0.1–1.0)
Monocytes Relative: 5 % (ref 3–12)
NEUTROS PCT: 78 % — AB (ref 43–77)
Neutro Abs: 5.1 10*3/uL (ref 1.7–7.7)
Platelets: 245 10*3/uL (ref 150–400)
RBC: 5.84 MIL/uL — ABNORMAL HIGH (ref 3.87–5.11)
RDW: 13.8 % (ref 11.5–15.5)
WBC: 6.7 10*3/uL (ref 4.0–10.5)

## 2014-11-01 LAB — TROPONIN I: Troponin I: <0.03 ng/mL (ref <0.031)

## 2014-11-01 LAB — I-STAT CG4 LACTIC ACID, ED: Lactic Acid, Venous: 2.26 mmol/L — ABNORMAL HIGH (ref 0.5–2.2)

## 2014-11-01 MED ORDER — CLOPIDOGREL BISULFATE 75 MG PO TABS
75.0000 mg | ORAL_TABLET | Freq: Once | ORAL | Status: AC
  Administered 2014-11-01: 75 mg via ORAL
  Filled 2014-11-01: qty 1

## 2014-11-01 MED ORDER — CLOPIDOGREL BISULFATE 75 MG PO TABS
75.0000 mg | ORAL_TABLET | Freq: Every day | ORAL | Status: DC
Start: 2014-11-01 — End: 2023-09-20

## 2014-11-01 NOTE — ED Notes (Signed)
Patient O2 at 80% pt placed on 2L SATS 98%

## 2014-11-01 NOTE — ED Notes (Signed)
Pt was only able to take about 5 steps away from the bed before having to turn around and lay back down

## 2014-11-01 NOTE — ED Provider Notes (Signed)
21:20-final disposition, was delayed for return of CNOP, and follow-up on the abnormalities seen on blood work.  Patient had elevated lactate, low CO2, and elevated AST.  Additionally, she has a high calcium, unchanged from baseline.  In June 2015, but much higher than October 2012.  Component     Latest Ref Rng 08/20/2011 04/11/2014 04/11/2014 11/01/2014          1:10 PM  1:17 PM   Sodium     135 - 145 mmol/L 143 139 138 135  Potassium     3.5 - 5.1 mmol/L 3.6 4.0 3.8 4.4  Chloride     96 - 112 mEq/L 117 (H) 102 103 97  CO2     19 - 32 mmol/L 21 24  18  (L)  Glucose     70 - 99 mg/dL 82 90 88 82  BUN     6 - 23 mg/dL 11 19 19 14   Creatinine     0.50 - 1.10 mg/dL 0.62 1.09 1.20 (H) 1.08  Calcium     8.4 - 10.5 mg/dL 8.3 (L) 12.0 (H)  12.8 (H)  Total Protein     6.0 - 8.3 g/dL  7.2  7.7  Albumin     3.5 - 5.2 g/dL  3.6  3.8  AST     0 - 37 U/L  26  41 (H)  ALT     0 - 35 U/L  18  13  Alkaline Phosphatase     39 - 117 U/L  72  72  Total Bilirubin     0.3 - 1.2 mg/dL  0.4  0.9  GFR calc non Af Amer     >90 mL/min 79 (L) 43 (L)  44 (L)  GFR calc Af Amer     >90 mL/min >90 50 (L)  51 (L)  Anion gap     5 - 15    20 (H)    Results for orders placed or performed during the hospital encounter of 11/01/14  CBC with Differential  Result Value Ref Range   WBC 6.7 4.0 - 10.5 K/uL   RBC 5.84 (H) 3.87 - 5.11 MIL/uL   Hemoglobin 16.0 (H) 12.0 - 15.0 g/dL   HCT 47.2 (H) 36.0 - 46.0 %   MCV 80.8 78.0 - 100.0 fL   MCH 27.4 26.0 - 34.0 pg   MCHC 33.9 30.0 - 36.0 g/dL   RDW 13.8 11.5 - 15.5 %   Platelets 245 150 - 400 K/uL   Neutrophils Relative % 78 (H) 43 - 77 %   Neutro Abs 5.1 1.7 - 7.7 K/uL   Lymphocytes Relative 17 12 - 46 %   Lymphs Abs 1.2 0.7 - 4.0 K/uL   Monocytes Relative 5 3 - 12 %   Monocytes Absolute 0.4 0.1 - 1.0 K/uL   Eosinophils Relative 0 0 - 5 %   Eosinophils Absolute 0.0 0.0 - 0.7 K/uL   Basophils Relative 0 0 - 1 %   Basophils Absolute 0.0 0.0 - 0.1 K/uL   Urinalysis, Routine w reflex microscopic  Result Value Ref Range   Color, Urine YELLOW YELLOW   APPearance CLEAR CLEAR   Specific Gravity, Urine 1.022 1.005 - 1.030   pH 5.5 5.0 - 8.0   Glucose, UA NEGATIVE NEGATIVE mg/dL   Hgb urine dipstick NEGATIVE NEGATIVE   Bilirubin Urine LARGE (A) NEGATIVE   Ketones, ur 40 (A) NEGATIVE mg/dL   Protein, ur NEGATIVE NEGATIVE mg/dL  Urobilinogen, UA 1.0 0.0 - 1.0 mg/dL   Nitrite NEGATIVE NEGATIVE   Leukocytes, UA NEGATIVE NEGATIVE  Troponin I  Result Value Ref Range   Troponin I <0.03 <0.031 ng/mL  Comprehensive metabolic panel  Result Value Ref Range   Sodium 135 135 - 145 mmol/L   Potassium 4.4 3.5 - 5.1 mmol/L   Chloride 97 96 - 112 mEq/L   CO2 18 (L) 19 - 32 mmol/L   Glucose, Bld 82 70 - 99 mg/dL   BUN 14 6 - 23 mg/dL   Creatinine, Ser 1.08 0.50 - 1.10 mg/dL   Calcium 12.8 (H) 8.4 - 10.5 mg/dL   Total Protein 7.7 6.0 - 8.3 g/dL   Albumin 3.8 3.5 - 5.2 g/dL   AST 41 (H) 0 - 37 U/L   ALT 13 0 - 35 U/L   Alkaline Phosphatase 72 39 - 117 U/L   Total Bilirubin 0.9 0.3 - 1.2 mg/dL   GFR calc non Af Amer 44 (L) >90 mL/min   GFR calc Af Amer 51 (L) >90 mL/min   Anion gap 20 (H) 5 - 15  I-Stat CG4 Lactic Acid, ED  Result Value Ref Range   Lactic Acid, Venous 2.26 (H) 0.5 - 2.2 mmol/L    Patient evaluated by me, at 20:30 hours.  She was alert, cooperative.  Because members were moist.  She has been able to tolerate oral liquids.  She has been able to ambulate with mild assistance here in the emergency department.  Findings were discussed with family members who are with her.  They requested a dose of Plavix, prior to leaving, and a new prescription for same.  Note that they had stopped the Plavix about 2 weeks ago because they thought it was for pain.  She has a PCP, available for follow-up care.  Assessment: Possible TIA, with resolution of symptoms and return to baseline.  Minor metabolic abnormalities and possible mild dehydration, all  of which will need follow-up evaluation and treatment by her PCP.  The family has agreed to take her home and preferred that to be the ongoing management plan at this time.  They are aware that a TIA can indicate increased risk for CVA.  Richarda Blade, MD 11/01/14 2125

## 2014-11-01 NOTE — ED Provider Notes (Addendum)
CSN: [id number]     Arrival date & time 11/01/14  1326 History   First MD Initiated Contact with Patient 11/01/14 1328     Chief Complaint  Patient presents with  . Altered Mental Status     HPI  Patient presents for evaluation of a decreased mental status. Son states that she was in her normal state of health this morning. He would help her to her bedside commode. He was in walking her out of the room. He states that she looked at him "funny" and that her speech seemed garbled. He sat her down. He states that she closed her eyes. She did not syncope. She did not have seizure. He laid her down and she seemed to get better. However he states he became concerned and he called 911 and she was transferred here without difficulties with speech or motor strength noted in route.  Past Medical History  Diagnosis Date  . Urinary tract infection, site not specified   . Hematuria, unspecified   . Dysuria   . Pain in joint, site unspecified   . Alzheimer's disease   . Essential hypertension, malignant   . Osteoarthrosis, unspecified whether generalized or localized, unspecified site   . Chronic kidney disease, stage II (mild)   . Unspecified vitamin D deficiency   . Hypertension   . Stroke    No past surgical history on file. Family History  Problem Relation Age of Onset  . Hypertension Mother   . Hypertension Father    History  Substance Use Topics  . Smoking status: Former Research scientist (life sciences)  . Smokeless tobacco: Not on file  . Alcohol Use: No   OB History    No data available     Review of Systems  Unable to perform ROS: Dementia      Allergies  Cephalexin and Penicillins  Home Medications   Prior to Admission medications   Medication Sig Start Date End Date Taking? Authorizing Provider  acetaminophen (TYLENOL) 500 MG tablet Take 500 mg by mouth every 6 (six) hours as needed for mild pain.    Historical Provider, MD  Chlorhexidine Gluconate Cloth 2 % PADS Apply 6 each topically  daily at 6 (six) AM. 04/13/14   David L Rinehuls, PA-C  clopidogrel (PLAVIX) 75 MG tablet Take 1 tablet (75 mg total) by mouth daily with breakfast. 04/13/14   Shanon Brow L Rinehuls, PA-C  donepezil (ARICEPT) 5 MG tablet Take 5 mg by mouth at bedtime.     Historical Provider, MD  loratadine (CLARITIN) 10 MG tablet Take 10 mg by mouth daily.    Historical Provider, MD  Multiple Vitamin (MULTIVITAMIN) tablet Take 1 tablet by mouth daily.      Historical Provider, MD  mupirocin ointment (BACTROBAN) 2 % Place 1 application into the nose 2 (two) times daily. 04/13/14   David L Rinehuls, PA-C  nitroGLYCERIN (NITROSTAT) 0.4 MG SL tablet Place 0.4 mg under the tongue every 5 (five) minutes as needed for chest pain.     Historical Provider, MD  OLANZapine (ZYPREXA) 10 MG tablet Take 10 mg by mouth at bedtime.    Historical Provider, MD  Omega-3 Fatty Acids (FISH OIL) 1200 MG CAPS Take 1 capsule by mouth every other day.    Historical Provider, MD   BP 125/69 mmHg  Pulse 80  Temp(Src) 97.8 F (36.6 C) (Oral)  Resp 21  SpO2 100% Physical Exam  Constitutional: She is oriented to person, place, and time. She appears well-developed and well-nourished. No  distress.  Thin and frail.  HENT:  Head: Normocephalic.  Eyes: Conjunctivae are normal. Pupils are equal, round, and reactive to light. No scleral icterus.  Neck: Normal range of motion. Neck supple. No thyromegaly present.  Cardiovascular: Normal rate and regular rhythm.  Exam reveals no gallop and no friction rub.   No murmur heard. Pulmonary/Chest: Effort normal and breath sounds normal. No respiratory distress. She has no wheezes. She has no rales.  Abdominal: Soft. Bowel sounds are normal. She exhibits no distension. There is no tenderness. There is no rebound.  Musculoskeletal: Normal range of motion.  Neurological: She is alert and oriented to person, place, and time.  Easily confused with complex questioning. Able to answer simple questions. Symmetric  smile and cranial nerve exam. No pronator drift and normal neurological exam the upper or lower extremities.  Skin: Skin is warm and dry. No rash noted.  Psychiatric: She has a normal mood and affect. Her behavior is normal.    ED Course  Procedures (including critical care time) Labs Review Labs Reviewed  CBC WITH DIFFERENTIAL - Abnormal; Notable for the following:    RBC 5.84 (*)    Hemoglobin 16.0 (*)    HCT 47.2 (*)    Neutrophils Relative % 78 (*)    All other components within normal limits  URINALYSIS, ROUTINE W REFLEX MICROSCOPIC - Abnormal; Notable for the following:    Bilirubin Urine LARGE (*)    Ketones, ur 40 (*)    All other components within normal limits  I-STAT CG4 LACTIC ACID, ED - Abnormal; Notable for the following:    Lactic Acid, Venous 2.26 (*)    All other components within normal limits  URINE CULTURE  TROPONIN I  COMPREHENSIVE METABOLIC PANEL    Imaging Review Ct Head Wo Contrast  11/01/2014   CLINICAL DATA:  Left-sided weakness and slurred speech. Initial encounter.  EXAM: CT HEAD WITHOUT CONTRAST  TECHNIQUE: Contiguous axial images were obtained from the base of the skull through the vertex without intravenous contrast.  COMPARISON:  04/12/2014.  FINDINGS: No mass lesion, mass effect, midline shift, hydrocephalus, hemorrhage. No acute territorial cortical ischemia/infarct. Atrophy and chronic ischemic white matter disease is present. Benign basal ganglia calcifications are present. Intracranial atherosclerosis. Hyperostosis of the skull. Visible paranasal sinuses appear normal.  IMPRESSION: Atrophy and chronic ischemic white matter disease without acute intracranial abnormality.   Electronically Signed   By: Dereck Ligas M.D.   On: 11/01/2014 16:14   Dg Chest Port 1 View  11/01/2014   CLINICAL DATA:  Generalize weakness. Acute mental status changes. Current history of hypertension and stage 2 chronic kidney disease.  EXAM: PORTABLE CHEST - 1 VIEW   COMPARISON:  CT chest 6/13/1,015. Portable chest x-ray 04/11/2014. Two-view chest x-ray 08/13/2011.  FINDINGS: Cardiac silhouette upper normal in size to slightly enlarged, unchanged. Thoracic aorta mildly tortuous and atherosclerotic, unchanged. Hilar and mediastinal contours otherwise unremarkable. Lungs clear. Bronchovascular markings normal. Pulmonary vascularity normal. No visible pleural effusions. No pneumothorax. Calcification at the insertion of the right supraspinatus tendon on the greater tuberosity of the right humerus.  IMPRESSION: 1.  No acute cardiopulmonary disease. 2. Stable borderline heart size. 3. Calcific supraspinatus tendinitis involving the right shoulder.   Electronically Signed   By: Evangeline Dakin M.D.   On: 11/01/2014 14:58     EKG Interpretation None      MDM   Final diagnoses:  Weakness    Patient has a nonfocal neurological exam upon arrival here. Has  obvious dementia. Is able to participate in her exam. Follow commands. No deficits. Clear speech. Her sons arrived. One of whom lives with her. He feels she is back to her baseline. What he describes as an episode where she seemed to have slurred speech and seemed generalized weak left greater than right. As I examine her with him in the room he states "this is much better than she was at home". She is able to eat and drink without difficulty or dysphagia. We discussed TIA. He inquires about her Plavix when I mention this to him. He states that he had quit giving that to her about 2 weeks ago. Encouraged him to restart her Plavix at home. I think she would be appropriate for treatment. At this point her compresses about what profiles pending. If these show normal values a think she'll be appropriate for discharge home to resume her Plavix. Sons who called with this plan.    Tanna Furry, MD 11/01/14 [place], MD 11/01/14 [phone]

## 2014-11-01 NOTE — ED Notes (Signed)
Per EMS pt is from home Son stated around 12 he notice Lt side weakness and slurred speech. Pt has hx of stroke and family could not stated how was different that normal.  Family unable to state Last seen normal. Vitals with EMS 98.4 oral CBG 124 99 HR 126/72n 94%.

## 2014-11-01 NOTE — Discharge Instructions (Signed)
Resume her Plavix 75 mg once per day. Return to ER with any changes or worsening symptoms.  Transient Ischemic Attack A transient ischemic attack (TIA) is a "warning stroke" that causes stroke-like symptoms. Unlike a stroke, a TIA does not cause permanent damage to the brain. The symptoms of a TIA can happen very fast and do not last long. It is important to know the symptoms of a TIA and what to do. This can help prevent a major stroke or death. CAUSES   A TIA is caused by a temporary blockage in an artery in the brain or neck (carotid artery). The blockage does not allow the brain to get the blood supply it needs and can cause different symptoms. The blockage can be caused by either:  A blood clot.  Fatty buildup (plaque) in a neck or brain artery. RISK FACTORS  High blood pressure (hypertension).  High cholesterol.  Diabetes mellitus.  Heart disease.  The build up of plaque in the blood vessels (peripheral artery disease or atherosclerosis).  The build up of plaque in the blood vessels providing blood and oxygen to the brain (carotid artery stenosis).  An abnormal heart rhythm (atrial fibrillation).  Obesity.  Smoking.  Taking oral contraceptives (especially in combination with smoking).  Physical inactivity.  A diet high in fats, salt (sodium), and calories.  Alcohol use.  Use of illegal drugs (especially cocaine and methamphetamine).  Being female.  Being African American.  Being over the age of 7.  Family history of stroke.  Previous history of blood clots, stroke, TIA, or heart attack.  Sickle cell disease. SYMPTOMS  TIA symptoms are the same as a stroke but are temporary. These symptoms usually develop suddenly, or may be newly present upon awakening from sleep:  Sudden weakness or numbness of the face, arm, or leg, especially on one side of the body.  Sudden trouble walking or difficulty moving arms or legs.  Sudden confusion.  Sudden personality  changes.  Trouble speaking (aphasia) or understanding.  Difficulty swallowing.  Sudden trouble seeing in one or both eyes.  Double vision.  Dizziness.  Loss of balance or coordination.  Sudden severe headache with no known cause.  Trouble reading or writing.  Loss of bowel or bladder control.  Loss of consciousness. DIAGNOSIS  Your caregiver may be able to determine the presence or absence of a TIA based on your symptoms, history, and physical exam. Computed tomography (CT scan) of the brain is usually performed to help identify a TIA. Other tests may be done to diagnose a TIA. These tests may include:  Electrocardiography.  Continuous heart monitoring.  Echocardiography.  Carotid ultrasonography.  Magnetic resonance imaging (MRI).  A scan of the brain circulation.  Blood tests. PREVENTION  The risk of a TIA can be decreased by appropriately treating high blood pressure, high cholesterol, diabetes, heart disease, and obesity and by quitting smoking, limiting alcohol, and staying physically active. TREATMENT  Time is of the essence. Since the symptoms of TIA are the same as a stroke, it is important to seek treatment as soon as possible because you may need a medicine to dissolve the clot (thrombolytic) that cannot be given if too much time has passed. Treatment options vary. Treatment options may include rest, oxygen, intravenous (IV) fluids, and medicines to thin the blood (anticoagulants). Medicines and diet may be used to address diabetes, high blood pressure, and other risk factors. Measures will be taken to prevent short-term and long-term complications, including infection from breathing  foreign material into the lungs (aspiration pneumonia), blood clots in the legs, and falls. Treatment options include procedures to either remove plaque in the carotid arteries or dilate carotid arteries that have narrowed due to plaque. Those procedures are:  Carotid  endarterectomy.  Carotid angioplasty and stenting. HOME CARE INSTRUCTIONS   Take all medicines prescribed by your caregiver. Follow the directions carefully. Medicines may be used to control risk factors for a stroke. Be sure you understand all your medicine instructions.  You may be told to take aspirin or the anticoagulant warfarin. Warfarin needs to be taken exactly as instructed.  Taking too much or too little warfarin is dangerous. Too much warfarin increases the risk of bleeding. Too little warfarin continues to allow the risk for blood clots. While taking warfarin, you will need to have regular blood tests to measure your blood clotting time. A PT blood test measures how long it takes for blood to clot. Your PT is used to calculate another value called an INR. Your PT and INR help your caregiver to adjust your dose of warfarin. The dose can change for many reasons. It is critically important that you take warfarin exactly as prescribed.  Many foods, especially foods high in vitamin K can interfere with warfarin and affect the PT and INR. Foods high in vitamin K include spinach, kale, broccoli, cabbage, collard and turnip greens, brussels sprouts, peas, cauliflower, seaweed, and parsley as well as beef and pork liver, green tea, and soybean oil. You should eat a consistent amount of foods high in vitamin K. Avoid major changes in your diet, or notify your caregiver before changing your diet. Arrange a visit with a dietitian to answer your questions.  Many medicines can interfere with warfarin and affect the PT and INR. You must tell your caregiver about any and all medicines you take, this includes all vitamins and supplements. Be especially cautious with aspirin and anti-inflammatory medicines. Do not take or discontinue any prescribed or over-the-counter medicine except on the advice of your caregiver or pharmacist.  Warfarin can have side effects, such as excessive bruising or bleeding. You  will need to hold pressure over cuts for longer than usual. Your caregiver or pharmacist will discuss other potential side effects.  Avoid sports or activities that may cause injury or bleeding.  Be mindful when shaving, flossing your teeth, or handling sharp objects.  Alcohol can change the body's ability to handle warfarin. It is best to avoid alcoholic drinks or consume only very small amounts while taking warfarin. Notify your caregiver if you change your alcohol intake.  Notify your dentist or other caregivers before procedures.  Eat a diet that includes 5 or more servings of fruits and vegetables each day. This may reduce the risk of stroke. Certain diets may be prescribed to address high blood pressure, high cholesterol, diabetes, or obesity.  A low-sodium, low-saturated fat, low-trans fat, low-cholesterol diet is recommended to manage high blood pressure.  A low-saturated fat, low-trans fat, low-cholesterol, and high-fiber diet may control cholesterol levels.  A controlled-carbohydrate, controlled-sugar diet is recommended to manage diabetes.  A reduced-calorie, low-sodium, low-saturated fat, low-trans fat, low-cholesterol diet is recommended to manage obesity.  Maintain a healthy weight.  Stay physically active. It is recommended that you get at least 30 minutes of activity on most or all days.  Do not smoke.  Limit alcohol use even if you are not taking warfarin. Moderate alcohol use is considered to be:  No more than 2 drinks each  day for men.  No more than 1 drink each day for nonpregnant women.  Stop drug abuse.  Home safety. A safe home environment is important to reduce the risk of falls. Your caregiver may arrange for specialists to evaluate your home. Having grab bars in the bedroom and bathroom is often important. Your caregiver may arrange for equipment to be used at home, such as raised toilets and a seat for the shower.  Follow all instructions for follow-up  with your caregiver. This is very important. This includes any referrals and lab tests. Proper follow up can prevent a stroke or another TIA from occurring. SEEK MEDICAL CARE IF:  You have personality changes.  You have difficulty swallowing.  You are seeing double.  You have dizziness.  You have a fever.  You have skin breakdown. SEEK IMMEDIATE MEDICAL CARE IF:  Any of these symptoms may represent a serious problem that is an emergency. Do not wait to see if the symptoms will go away. Get medical help right away. Call your local emergency services (911 in [place] Do not drive yourself to the hospital.  You have sudden weakness or numbness of the face, arm, or leg, especially on one side of the body.  You have sudden trouble walking or difficulty moving arms or legs.  You have sudden confusion.  You have trouble speaking (aphasia) or understanding.  You have sudden trouble seeing in one or both eyes.  You have a loss of balance or coordination.  You have a sudden, severe headache with no known cause.  You have new chest pain or an irregular heartbeat.  You have a partial or total loss of consciousness. MAKE SURE YOU:   Understand these instructions.  Will watch your condition.  Will get help right away if you are not doing well or get worse. Document Released: 07/28/2005 Document Revised: 10/23/2013 Document Reviewed: 01/23/2014 [facility] Patient Information 2015 [place], [place]. This information is not intended to replace advice given to you by your health care provider. Make sure you discuss any questions you have with your health care provider.

## 2014-11-01 NOTE — ED Notes (Signed)
Per Dr. Eulis Foster patient is able to have crackers and water since difficulty swallowing is "course meat" per family.

## 2014-11-01 NOTE — ED Notes (Signed)
Discharge instructions reviewed with pt's two sons, no further questions at this time. Signature pad in room not working, unable to obtain signature.

## 2014-11-01 NOTE — ED Notes (Signed)
MD at bedside. 

## 2014-11-01 NOTE — ED Notes (Addendum)
This nurse wittiness pt drinking water without any problems Listen to Lung sounds pt lungs clear before and after, This nurse completed a swallow screen. PT passed

## 2014-11-03 ENCOUNTER — Emergency Department (HOSPITAL_COMMUNITY): Payer: Medicare Other

## 2014-11-03 ENCOUNTER — Encounter (HOSPITAL_COMMUNITY): Payer: Self-pay | Admitting: *Deleted

## 2014-11-03 ENCOUNTER — Inpatient Hospital Stay (HOSPITAL_COMMUNITY): Payer: Medicare Other

## 2014-11-03 ENCOUNTER — Inpatient Hospital Stay (HOSPITAL_COMMUNITY)
Admission: EM | Admit: 2014-11-03 | Discharge: 2014-11-08 | DRG: 640 | Disposition: A | Discharge status: Skilled Nursing Facility | Payer: Medicare Other | Attending: Internal Medicine | Admitting: Internal Medicine

## 2014-11-03 DIAGNOSIS — N179 Acute kidney failure, unspecified: Secondary | ICD-10-CM | POA: Diagnosis present

## 2014-11-03 DIAGNOSIS — Z8673 Personal history of transient ischemic attack (TIA), and cerebral infarction without residual deficits: Secondary | ICD-10-CM

## 2014-11-03 DIAGNOSIS — G309 Alzheimer's disease, unspecified: Secondary | ICD-10-CM

## 2014-11-03 DIAGNOSIS — Z880 Allergy status to penicillin: Secondary | ICD-10-CM

## 2014-11-03 DIAGNOSIS — F0280 Dementia in other diseases classified elsewhere without behavioral disturbance: Secondary | ICD-10-CM | POA: Diagnosis present

## 2014-11-03 DIAGNOSIS — R278 Other lack of coordination: Secondary | ICD-10-CM

## 2014-11-03 DIAGNOSIS — E162 Hypoglycemia, unspecified: Secondary | ICD-10-CM | POA: Diagnosis present

## 2014-11-03 DIAGNOSIS — R5383 Other fatigue: Secondary | ICD-10-CM | POA: Diagnosis not present

## 2014-11-03 DIAGNOSIS — I10 Essential (primary) hypertension: Secondary | ICD-10-CM

## 2014-11-03 DIAGNOSIS — E43 Unspecified severe protein-calorie malnutrition: Secondary | ICD-10-CM | POA: Diagnosis present

## 2014-11-03 DIAGNOSIS — G9341 Metabolic encephalopathy: Secondary | ICD-10-CM | POA: Diagnosis present

## 2014-11-03 DIAGNOSIS — Z87891 Personal history of nicotine dependence: Secondary | ICD-10-CM

## 2014-11-03 DIAGNOSIS — E860 Dehydration: Secondary | ICD-10-CM | POA: Diagnosis present

## 2014-11-03 DIAGNOSIS — G9340 Encephalopathy, unspecified: Secondary | ICD-10-CM

## 2014-11-03 DIAGNOSIS — Z7982 Long term (current) use of aspirin: Secondary | ICD-10-CM | POA: Diagnosis not present

## 2014-11-03 DIAGNOSIS — D72829 Elevated white blood cell count, unspecified: Secondary | ICD-10-CM | POA: Diagnosis present

## 2014-11-03 DIAGNOSIS — R531 Weakness: Secondary | ICD-10-CM

## 2014-11-03 DIAGNOSIS — R627 Adult failure to thrive: Secondary | ICD-10-CM | POA: Diagnosis present

## 2014-11-03 DIAGNOSIS — R32 Unspecified urinary incontinence: Secondary | ICD-10-CM | POA: Diagnosis present

## 2014-11-03 DIAGNOSIS — C50911 Malignant neoplasm of unspecified site of right female breast: Secondary | ICD-10-CM | POA: Diagnosis present

## 2014-11-03 DIAGNOSIS — I129 Hypertensive chronic kidney disease with stage 1 through stage 4 chronic kidney disease, or unspecified chronic kidney disease: Secondary | ICD-10-CM | POA: Diagnosis present

## 2014-11-03 DIAGNOSIS — Z66 Do not resuscitate: Secondary | ICD-10-CM | POA: Diagnosis present

## 2014-11-03 DIAGNOSIS — R05 Cough: Secondary | ICD-10-CM | POA: Insufficient documentation

## 2014-11-03 DIAGNOSIS — Z888 Allergy status to other drugs, medicaments and biological substances status: Secondary | ICD-10-CM

## 2014-11-03 DIAGNOSIS — Z681 Body mass index (BMI) 19 or less, adult: Secondary | ICD-10-CM

## 2014-11-03 DIAGNOSIS — N182 Chronic kidney disease, stage 2 (mild): Secondary | ICD-10-CM | POA: Diagnosis present

## 2014-11-03 DIAGNOSIS — M1990 Unspecified osteoarthritis, unspecified site: Secondary | ICD-10-CM | POA: Diagnosis present

## 2014-11-03 DIAGNOSIS — R059 Cough, unspecified: Secondary | ICD-10-CM | POA: Insufficient documentation

## 2014-11-03 LAB — URINALYSIS, ROUTINE W REFLEX MICROSCOPIC
Glucose, UA: NEGATIVE mg/dL
Hgb urine dipstick: NEGATIVE
Ketones, ur: 15 mg/dL — AB
Leukocytes, UA: NEGATIVE
Nitrite: NEGATIVE
PROTEIN: NEGATIVE mg/dL
SPECIFIC GRAVITY, URINE: 1.024 (ref 1.005–1.030)
UROBILINOGEN UA: 1 mg/dL (ref 0.0–1.0)
pH: 5 (ref 5.0–8.0)

## 2014-11-03 LAB — CBC WITH DIFFERENTIAL/PLATELET
BASOS ABS: 0 10*3/uL (ref 0.0–0.1)
Basophils Relative: 0 % (ref 0–1)
EOS ABS: 0 10*3/uL (ref 0.0–0.7)
EOS PCT: 0 % (ref 0–5)
HCT: 40.3 % (ref 36.0–46.0)
Hemoglobin: 13.5 g/dL (ref 12.0–15.0)
Lymphocytes Relative: 4 % — ABNORMAL LOW (ref 12–46)
Lymphs Abs: 0.6 10*3/uL — ABNORMAL LOW (ref 0.7–4.0)
MCH: 26.7 pg (ref 26.0–34.0)
MCHC: 33.5 g/dL (ref 30.0–36.0)
MCV: 79.8 fL (ref 78.0–100.0)
Monocytes Absolute: 0.7 10*3/uL (ref 0.1–1.0)
Monocytes Relative: 5 % (ref 3–12)
Neutro Abs: 13.3 10*3/uL — ABNORMAL HIGH (ref 1.7–7.7)
Neutrophils Relative %: 91 % — ABNORMAL HIGH (ref 43–77)
PLATELETS: 273 10*3/uL (ref 150–400)
RBC: 5.05 MIL/uL (ref 3.87–5.11)
RDW: 13.8 % (ref 11.5–15.5)
WBC: 14.6 10*3/uL — ABNORMAL HIGH (ref 4.0–10.5)

## 2014-11-03 LAB — COMPREHENSIVE METABOLIC PANEL
ALT: 15 U/L (ref 0–35)
ANION GAP: 19 — AB (ref 5–15)
AST: 40 U/L — ABNORMAL HIGH (ref 0–37)
Albumin: 3.8 g/dL (ref 3.5–5.2)
Alkaline Phosphatase: 73 U/L (ref 39–117)
BILIRUBIN TOTAL: 1.1 mg/dL (ref 0.3–1.2)
BUN: 17 mg/dL (ref 6–23)
CALCIUM: 13.1 mg/dL — AB (ref 8.4–10.5)
CHLORIDE: 97 meq/L (ref 96–112)
CO2: 20 mmol/L (ref 19–32)
CREATININE: 1.39 mg/dL — AB (ref 0.50–1.10)
GFR calc Af Amer: 37 mL/min — ABNORMAL LOW (ref >90)
GFR calc non Af Amer: 32 mL/min — ABNORMAL LOW (ref >90)
GLUCOSE: 101 mg/dL — AB (ref 70–99)
Potassium: 3.9 mmol/L (ref 3.5–5.1)
SODIUM: 136 mmol/L (ref 135–145)
TOTAL PROTEIN: 7.7 g/dL (ref 6.0–8.3)

## 2014-11-03 LAB — URINE CULTURE
Colony Count: NO GROWTH
Culture: NO GROWTH

## 2014-11-03 LAB — I-STAT TROPONIN, ED
TROPONIN I, POC: 0.02 ng/mL (ref 0.00–0.08)
Troponin i, poc: 0.01 ng/mL (ref 0.00–0.08)

## 2014-11-03 LAB — TSH: TSH: 1.144 u[IU]/mL (ref 0.350–4.500)

## 2014-11-03 MED ORDER — ONDANSETRON HCL 4 MG PO TABS: 4.0000 mg | ORAL_TABLET | Freq: Four times a day (QID) | ORAL | Status: DC | PRN

## 2014-11-03 MED ORDER — ACETAMINOPHEN 650 MG RE SUPP: 650.0000 mg | Freq: Four times a day (QID) | RECTAL | Status: DC | PRN

## 2014-11-03 MED ORDER — ACETAMINOPHEN 325 MG PO TABS
650.0000 mg | ORAL_TABLET | Freq: Four times a day (QID) | ORAL | Status: DC | PRN
  Administered 2014-11-04 – 2014-11-05 (×2): 650 mg via ORAL
  Filled 2014-11-03 (×2): qty 2

## 2014-11-03 MED ORDER — DONEPEZIL HCL 5 MG PO TABS
5.0000 mg | ORAL_TABLET | Freq: Every day | ORAL | Status: DC
  Administered 2014-11-04 – 2014-11-07 (×5): 5 mg via ORAL
  Filled 2014-11-03 (×6): qty 1

## 2014-11-03 MED ORDER — ONDANSETRON HCL 4 MG/2ML IJ SOLN: 4.0000 mg | Freq: Four times a day (QID) | INTRAMUSCULAR | Status: DC | PRN

## 2014-11-03 MED ORDER — SODIUM CHLORIDE 0.9 % IV BOLUS (SEPSIS)
500.0000 mL | Freq: Once | INTRAVENOUS | Status: AC
  Administered 2014-11-04: 500 mL via INTRAVENOUS

## 2014-11-03 MED ORDER — SODIUM CHLORIDE 0.9 % IV SOLN
INTRAVENOUS | Status: DC
  Administered 2014-11-04 – 2014-11-08 (×7): via INTRAVENOUS

## 2014-11-03 MED ORDER — ENOXAPARIN SODIUM 40 MG/0.4ML ~~LOC~~ SOLN: 40.0000 mg | SUBCUTANEOUS | Status: DC

## 2014-11-03 MED ORDER — SODIUM CHLORIDE 0.9 % IV BOLUS (SEPSIS)
500.0000 mL | Freq: Once | INTRAVENOUS | Status: AC
  Administered 2014-11-03: 500 mL via INTRAVENOUS

## 2014-11-03 MED ORDER — SODIUM CHLORIDE 0.9 % IJ SOLN
3.0000 mL | Freq: Two times a day (BID) | INTRAMUSCULAR | Status: DC
  Administered 2014-11-03 – 2014-11-08 (×5): 3 mL via INTRAVENOUS

## 2014-11-03 MED ORDER — ASPIRIN EC 81 MG PO TBEC
81.0000 mg | DELAYED_RELEASE_TABLET | Freq: Every day | ORAL | Status: DC
  Administered 2014-11-04 – 2014-11-08 (×6): 81 mg via ORAL
  Filled 2014-11-03 (×6): qty 1

## 2014-11-03 MED ORDER — ENOXAPARIN SODIUM 30 MG/0.3ML ~~LOC~~ SOLN
30.0000 mg | Freq: Every day | SUBCUTANEOUS | Status: DC
  Administered 2014-11-03 – 2014-11-07 (×4): 30 mg via SUBCUTANEOUS
  Filled 2014-11-03 (×6): qty 0.3

## 2014-11-03 NOTE — H&P (Signed)
[facility] History and Physical  Patient: Brianna SCATENA  Ferguson:[id number]  DOB: Feb 01, 1922  DOS: the patient was seen and examined on 11/03/2014 PCP: [name] ERIC, MD  Chief Complaint: Fatigue and lethargy  HPI: [name] is a 81 y.o. female with Past medical history of Alzheimer's disease, hypertension, most 2 arthritis, chronic kidney disease recurrent hypercalcemia in the past, . Patient presents with complaints of lethargy and generalized body pain. Patient recently presented in ER on January 1 with the complaint of lethargy, left-sided weakness and slurred speech. She also had complaints of generalized body ache and abdominal pain with poor oral intake.  Workup was done in the ER and her symptoms were improved and she was at her baseline with negative CT scan and she was sent home with Plavix. After going home the patient continues to complain off generalized body ache with abdominal pain. She was more sleepy and drowsy. She needs to be fed at her baseline but since last few days has not been eating at all. There is no recent change in her medications reported. Patient went to see her PCP who recommended her to be sent to ER for admission. No fall no trauma noted injury reported. No fever no chills no cough. No nausea no vomiting. No diarrhea. Patient did have an episode of incontinence of urine but no seizure-like activity no unresponsiveness.   patient has presented in the ER in the past with recurrent hypercalcemia which was thought to be secondary to calcium supplementation with hydrochlorothiazide and hypovolemia since extensive workup was unremarkable.  The patient is coming from home. And at her baseline independent for most of her ADL.  Review of Systems: as mentioned in the history of present illness.  A Comprehensive review of the other systems is negative.  Past Medical History  Diagnosis Date  . Urinary tract infection, site not specified   . Hematuria,  unspecified   . Dysuria   . Pain in joint, site unspecified   . Alzheimer's disease   . Essential hypertension, malignant   . Osteoarthrosis, unspecified whether generalized or localized, unspecified site   . Chronic kidney disease, stage II (mild)   . Unspecified vitamin D deficiency   . Hypertension   . Stroke    History reviewed. No pertinent past surgical history. Social History:  reports that she has quit smoking. She does not have any smokeless tobacco history on file. She reports that she does not drink alcohol. Her drug history is not on file.  Allergies  Allergen Reactions  . Cephalexin     unknown  . Penicillins     unknown    Family History  Problem Relation Age of Onset  . Hypertension Mother   . Hypertension Father     Prior to Admission medications   Medication Sig Start Date End Date Taking? Authorizing Provider  acetaminophen (TYLENOL) 500 MG tablet Take 500 mg by mouth every 6 (six) hours as needed for mild pain.   Yes Historical Provider, MD  aspirin EC 81 MG tablet Take 81 mg by mouth daily.   Yes Historical Provider, MD  Chlorpheniramine-DM (CORICIDIN COUGH/COLD) 4-30 MG TABS Take 1 tablet by mouth daily.    Yes Historical Provider, MD  clopidogrel (PLAVIX) 75 MG tablet Take 1 tablet (75 mg total) by mouth daily with breakfast. 11/01/14  Yes Richarda Blade, MD  donepezil (ARICEPT) 5 MG tablet Take 5 mg by mouth at bedtime.    Yes Historical Provider, MD  Multiple  Vitamin (MULTIVITAMIN) tablet Take 1 tablet by mouth daily.     Yes Historical Provider, MD  nitroGLYCERIN (NITROSTAT) 0.4 MG SL tablet Place 0.4 mg under the tongue every 5 (five) minutes as needed for chest pain.    Yes Historical Provider, MD  traMADol-acetaminophen (ULTRACET) 37.5-325 MG per tablet Take 1 tablet by mouth 2 (two) times daily as needed for moderate pain.   Yes Historical Provider, MD  triamterene-hydrochlorothiazide (MAXZIDE-25) 37.5-25 MG per tablet Take 1 tablet by mouth every  other day.   Yes Historical Provider, MD    Physical Exam: Filed Vitals:   11/03/14 2000 11/03/14 2015 11/03/14 2030 11/03/14 2103  BP: 130/70 116/67 102/67 129/69  Pulse: 99 91 99 84  Temp:    98.5 F (36.9 C)  TempSrc:    Oral  Resp: 21 24 25 18   Weight:    47.537 kg (104 lb 12.8 oz)  SpO2: 97% 97% 97% 95%    General: Alert, Awake and Oriented to Time, Place and Person. Appear in mild distress Eyes: PERRL ENT: Oral Mucosa clear dry. Neck: no JVD Cardiovascular: S1 and S2 Present, no Murmur, Peripheral Pulses Present Respiratory: Bilateral Air entry equal and Decreased, Clear to Auscultation, noCrackles, no wheezes Abdomen: Bowel Sound present, Soft and non tender Skin: no Rash Extremities: no Pedal edema, no calf tenderness Neurologic: Past-pointing on the left, generalized lethargy   Labs on Admission:  CBC:  Recent Labs Lab 11/01/14 1530 11/03/14 2035  WBC 6.7 14.6*  NEUTROABS 5.1 13.3*  HGB 16.0* 13.5  HCT 47.2* 40.3  MCV 80.8 79.8  PLT 245 273    CMP     Component Value Date/Time   NA 136 11/03/2014 1619   K 3.9 11/03/2014 1619   CL 97 11/03/2014 1619   CO2 20 11/03/2014 1619   GLUCOSE 101* 11/03/2014 1619   BUN 17 11/03/2014 1619   CREATININE 1.39* 11/03/2014 1619   CALCIUM 13.1* 11/03/2014 1619   CALCIUM 9.7 08/15/2011 1550   PROT 7.7 11/03/2014 1619   ALBUMIN 3.8 11/03/2014 1619   AST 40* 11/03/2014 1619   ALT 15 11/03/2014 1619   ALKPHOS 73 11/03/2014 1619   BILITOT 1.1 11/03/2014 1619   GFRNONAA 32* 11/03/2014 1619   GFRAA 37* 11/03/2014 1619    No results for input(s): LIPASE, AMYLASE in the last 168 hours. No results for input(s): AMMONIA in the last 168 hours.   Recent Labs Lab 11/01/14 1530  TROPONINI <0.03   BNP (last 3 results) No results for input(s): PROBNP in the last 8760 hours.  Radiological Exams on Admission: Dg Chest Port 1 View  11/03/2014   CLINICAL DATA:  Cough.  EXAM: PORTABLE CHEST - 1 VIEW  COMPARISON:   November 01, 2014.  FINDINGS: The heart size and mediastinal contours are within normal limits. Both lungs are clear. No pneumothorax or pleural effusion is noted. The visualized skeletal structures are unremarkable.  IMPRESSION: No acute cardiopulmonary abnormality seen.   Electronically Signed   By: Sabino Dick M.D.   On: 11/03/2014 18:05    EKG: Independently reviewed. normal sinus rhythm, nonspecific ST and T waves changes.  Assessment/Plan Principal Problem:   Hypercalcemia Active Problems:   Alzheimer's disease   Essential hypertension, malignant   Chronic kidney disease, stage II (mild)   Hypertension   1. Hypercalcemia Patient is presenting with complaints of generalized lethargy and increased sleepiness with generalized body pain. Workup shows that she has had progressively worsening serum calcium. Currently I would provide  her IV hydration aggressively and monitor her serum calcium. Most likely her current presentation with lethargy generalized abdominal pain muscle cramps is secondary to hypercalcemia. With a recurrent admission in the past workup has been unremarkable. Therefore I will treat her symptomatically.  2. Left-sided past-pointing. Lethargy. Obtain a CT scan to rule out any stroke. Since symptoms are ongoing for last 2 days a negative CT scan will reassure for possibility of stroke.  3. Alzheimer's disease. At present no behavioral disturbances. Continue close monitoring. Continue home medication.  4. Hypertension. Blood pressure at present stable. Holding diuretic medication.  Advance goals of care discussion: DNR/DNI as per my discussion with patient's family   DVT Prophylaxis: subcutaneous Heparin Nutrition: Nothing by mouth except medication  Family Communication: Both son was present at bedside, opportunity was given to ask question and all questions were answered satisfactorily at the time of interview. Disposition: Admitted to inpatient in  telemetry unit.  Author: Berle Mull, MD [facility] Pager: [phone] 11/03/2014, 9:24 PM    If 7PM-7AM, please contact night-coverage [email or web address] Password [email or web address]

## 2014-11-03 NOTE — ED Notes (Signed)
Per family member, pt was seen here two days ago for same. Pt having fatigue, altered and weak. Pt was dc home but family reports pt needing total care at home and pt not eating, pcp told them to bring pt here for admission.

## 2014-11-03 NOTE — Progress Notes (Signed)
Pt admitted to the unit. Pt is alert and oriented. Pt oriented to room, staff, and call bell. Educated on fall safety plan. Bed in lowest position. Full assessment to Epic. Call bell with in reach. Educated to call for assists. Will continue to monitor. Page to admitting physician for notification of arrival. Family at bedside. Mady Gemma, RN

## 2014-11-03 NOTE — ED Provider Notes (Signed)
CSN: [id number]     Arrival date & time 11/03/14  1553 History   First MD Initiated Contact with Patient 11/03/14 1716     Chief Complaint  Patient presents with  . Weakness     (Consider location/radiation/quality/duration/timing/severity/associated sxs/prior Treatment) HPI Comments: 81 yo female with hx of Alzheimer's, high blood pressure, chronic kidney disease presents with worsening general weakness. Patient recently seen in the ER for similar. Patient has mild worsening confusion than baseline. No fevers or chills. No focal deficits. Patient has not started to take Plavix yet. No other new medications.  Patient is a 81 y.o. female presenting with weakness. The history is provided by the patient and a relative.  Weakness Pertinent negatives include no chest pain, no abdominal pain, no headaches and no shortness of breath.    Past Medical History  Diagnosis Date  . Urinary tract infection, site not specified   . Hematuria, unspecified   . Dysuria   . Pain in joint, site unspecified   . Alzheimer's disease   . Essential hypertension, malignant   . Osteoarthrosis, unspecified whether generalized or localized, unspecified site   . Chronic kidney disease, stage II (mild)   . Unspecified vitamin D deficiency   . Hypertension   . Stroke    History reviewed. No pertinent past surgical history. Family History  Problem Relation Age of Onset  . Hypertension Mother   . Hypertension Father    History  Substance Use Topics  . Smoking status: Former Research scientist (life sciences)  . Smokeless tobacco: Not on file  . Alcohol Use: No   OB History    No data available     Review of Systems  Constitutional: Positive for fatigue. Negative for fever and chills.  HENT: Negative for congestion.   Eyes: Negative for visual disturbance.  Respiratory: Positive for cough. Negative for shortness of breath.   Cardiovascular: Negative for chest pain.  Gastrointestinal: Negative for vomiting and abdominal pain.   Genitourinary: Negative for dysuria and flank pain.  Musculoskeletal: Negative for back pain, neck pain and neck stiffness.  Skin: Negative for rash.  Neurological: Positive for weakness. Negative for light-headedness and headaches.      Allergies  Cephalexin and Penicillins  Home Medications   Prior to Admission medications   Medication Sig Start Date End Date Taking? Authorizing Provider  acetaminophen (TYLENOL) 500 MG tablet Take 500 mg by mouth every 6 (six) hours as needed for mild pain.   Yes Historical Provider, MD  aspirin EC 81 MG tablet Take 81 mg by mouth daily.   Yes Historical Provider, MD  Chlorpheniramine-DM (CORICIDIN COUGH/COLD) 4-30 MG TABS Take 1 tablet by mouth daily.    Yes Historical Provider, MD  clopidogrel (PLAVIX) 75 MG tablet Take 1 tablet (75 mg total) by mouth daily with breakfast. 11/01/14  Yes Richarda Blade, MD  donepezil (ARICEPT) 5 MG tablet Take 5 mg by mouth at bedtime.    Yes Historical Provider, MD  Multiple Vitamin (MULTIVITAMIN) tablet Take 1 tablet by mouth daily.     Yes Historical Provider, MD  nitroGLYCERIN (NITROSTAT) 0.4 MG SL tablet Place 0.4 mg under the tongue every 5 (five) minutes as needed for chest pain.    Yes Historical Provider, MD  traMADol-acetaminophen (ULTRACET) 37.5-325 MG per tablet Take 1 tablet by mouth 2 (two) times daily as needed for moderate pain.   Yes Historical Provider, MD  triamterene-hydrochlorothiazide (MAXZIDE-25) 37.5-25 MG per tablet Take 0.5 tablets by mouth daily.    Yes Historical  Provider, MD   BP 129/69 mmHg  Pulse 84  Temp(Src) 98.5 F (36.9 C) (Oral)  Resp 18  Wt 104 lb 12.8 oz (47.537 kg)  SpO2 95% Physical Exam  Constitutional: She appears well-developed. No distress.  HENT:  Head: Normocephalic and atraumatic.  Dry mucous membranes  Eyes: Right eye exhibits no discharge. Left eye exhibits no discharge.  Neck: Normal range of motion. Neck supple. No tracheal deviation present.   Cardiovascular: Normal rate and regular rhythm.   Pulmonary/Chest: Effort normal and breath sounds normal.  Abdominal: Soft. She exhibits no distension. There is no tenderness. There is no guarding.  Musculoskeletal: She exhibits no edema.  Neurological: She is alert. No cranial nerve deficit.  Nonfocal neuro exam, pleasant dementia, moves all extremities with equal strength bilateral, gross sensation intact bilateral, pupils equal, horizontal eye movements intact.  Skin: Skin is warm. No rash noted.  Psychiatric:  Pleasant dementia  Nursing note and vitals reviewed.   ED Course  Procedures (including critical care time) Labs Review Labs Reviewed  COMPREHENSIVE METABOLIC PANEL - Abnormal; Notable for the following:    Glucose, Bld 101 (*)    Creatinine, Ser 1.39 (*)    Calcium 13.1 (*)    AST 40 (*)    GFR calc non Af Amer 32 (*)    GFR calc Af Amer 37 (*)    Anion gap 19 (*)    All other components within normal limits  URINALYSIS, ROUTINE W REFLEX MICROSCOPIC - Abnormal; Notable for the following:    Color, Urine AMBER (*)    APPearance CLOUDY (*)    Bilirubin Urine LARGE (*)    Ketones, ur 15 (*)    All other components within normal limits  CBC WITH DIFFERENTIAL - Abnormal; Notable for the following:    WBC 14.6 (*)    Neutrophils Relative % 91 (*)    Neutro Abs 13.3 (*)    Lymphocytes Relative 4 (*)    Lymphs Abs 0.6 (*)    All other components within normal limits  URINE CULTURE  MRSA PCR SCREENING  TSH  COMPREHENSIVE METABOLIC PANEL  CBC  PROTIME-INR  I-STAT TROPOININ, ED  I-STAT TROPOININ, ED    Imaging Review Ct Head Wo Contrast  11/03/2014   CLINICAL DATA:  Acute onset of encephalopathy.  Initial encounter.  EXAM: CT HEAD WITHOUT CONTRAST  TECHNIQUE: Contiguous axial images were obtained from the base of the skull through the vertex without intravenous contrast.  COMPARISON:  CT of the head performed 11/01/2014  FINDINGS: There is no evidence of acute  infarction, mass lesion, or intra- or extra-axial hemorrhage on CT.  Prominence of the ventricles and sulci reflects mild to moderate cortical volume loss. Mild cerebellar atrophy is noted. Scattered periventricular and subcortical white matter change likely reflects small vessel ischemic microangiopathy.  The brainstem and fourth ventricle are within normal limits. The basal ganglia are unremarkable in appearance. The cerebral hemispheres demonstrate grossly normal gray-white differentiation. No mass effect or midline shift is seen.  There is no evidence of fracture; hyperostosis interna is noted. The visualized portions of the orbits are within normal limits. The paranasal sinuses and mastoid air cells are well-aerated. No significant soft tissue abnormalities are seen.  IMPRESSION: 1. No acute intracranial pathology seen on CT. 2. Mild to moderate cortical volume loss and scattered small vessel ischemic microangiopathy.   Electronically Signed   By: Garald Balding M.D.   On: 11/03/2014 22:41   Dg Chest [facility]  11/03/2014   CLINICAL DATA:  Cough.  EXAM: PORTABLE CHEST - 1 VIEW  COMPARISON:  November 01, 2014.  FINDINGS: The heart size and mediastinal contours are within normal limits. Both lungs are clear. No pneumothorax or pleural effusion is noted. The visualized skeletal structures are unremarkable.  IMPRESSION: No acute cardiopulmonary abnormality seen.   Electronically Signed   By: Sabino Dick M.D.   On: 11/03/2014 18:05     EKG Interpretation   Date/Time:  Sunday November 03 2014 16:03:12 EST Ventricular Rate:  117 PR Interval:  132 QRS Duration: 70 QT Interval:  346 QTC Calculation: 482 R Axis:   70 Text Interpretation:  Sinus tachycardia poor baseline Confirmed by Laquinton Bihm   MD, Tywan Siever ([id number]) on 11/03/2014 5:46:30 PM      MDM   Final diagnoses:  Cough  General weakness  Hypercalcemia  Dehydration  Acute renal failure, unspecified acute renal failure type  ARF  Clinically  concern for metabolic as cause of general weakness, clinically dehydrated, calcium elevated. IV fluid bolus ordered. CBC initially canceled for unknown reason, repeat order pending. Discussed with [facility] hospitalist for telemetry admission. Urinalysis and CBC pending. Patient recently had a CT head reviewed results unremarkable, similar presentation no acute neuro findings or deficits except general weakness.  The patients results and plan were reviewed and discussed.   Any x-rays performed were personally reviewed by myself.   Differential diagnosis were considered with the presenting HPI.  Medications  aspirin EC tablet 81 mg (81 mg Oral Given 11/04/14 0000)  donepezil (ARICEPT) tablet 5 mg (5 mg Oral Given 11/04/14 0001)  sodium chloride 0.9 % injection 3 mL (3 mLs Intravenous Given 11/03/14 2344)  0.9 %  sodium chloride infusion ( Intravenous New Bag/Given 11/04/14 0012)  acetaminophen (TYLENOL) tablet 650 mg (not administered)    Or  acetaminophen (TYLENOL) suppository 650 mg (not administered)  ondansetron (ZOFRAN) tablet 4 mg (not administered)    Or  ondansetron (ZOFRAN) injection 4 mg (not administered)  enoxaparin (LOVENOX) injection 30 mg (30 mg Subcutaneous Given 11/03/14 2354)  sodium chloride 0.9 % bolus 500 mL (0 mLs Intravenous Stopped 11/03/14 2002)  sodium chloride 0.9 % bolus 500 mL (500 mLs Intravenous Given 11/04/14 0012)    Filed Vitals:   11/03/14 2000 11/03/14 2015 11/03/14 2030 11/03/14 2103  BP: 130/70 116/67 102/67 129/69  Pulse: 99 91 99 84  Temp:    98.5 F (36.9 C)  TempSrc:    Oral  Resp: 21 24 25 18   Weight:    104 lb 12.8 oz (47.537 kg)  SpO2: 97% 97% 97% 95%    Final diagnoses:  Cough  General weakness  Hypercalcemia  Dehydration  Acute renal failure, unspecified acute renal failure type    Admission/ observation were discussed with the admitting physician, patient and/or family and they are comfortable with the plan.     Mariea Clonts,  MD 11/04/14 0030

## 2014-11-03 NOTE — ED Notes (Signed)
Phlebotomy at bedside.

## 2014-11-04 LAB — CBC
HCT: 36.1 % (ref 36.0–46.0)
Hemoglobin: 11.9 g/dL — ABNORMAL LOW (ref 12.0–15.0)
MCH: 26.3 pg (ref 26.0–34.0)
MCHC: 33 g/dL (ref 30.0–36.0)
MCV: 79.7 fL (ref 78.0–100.0)
Platelets: 273 10*3/uL (ref 150–400)
RBC: 4.53 MIL/uL (ref 3.87–5.11)
RDW: 13.8 % (ref 11.5–15.5)
WBC: 11.8 10*3/uL — ABNORMAL HIGH (ref 4.0–10.5)

## 2014-11-04 LAB — COMPREHENSIVE METABOLIC PANEL
ALBUMIN: 2.7 g/dL — AB (ref 3.5–5.2)
ALT: 11 U/L (ref 0–35)
ANION GAP: 6 (ref 5–15)
AST: 25 U/L (ref 0–37)
Alkaline Phosphatase: 51 U/L (ref 39–117)
BUN: 17 mg/dL (ref 6–23)
CO2: 25 mmol/L (ref 19–32)
CREATININE: 1.14 mg/dL — AB (ref 0.50–1.10)
Calcium: 11.3 mg/dL — ABNORMAL HIGH (ref 8.4–10.5)
Chloride: 107 mEq/L (ref 96–112)
GFR calc non Af Amer: 41 mL/min — ABNORMAL LOW (ref >90)
GFR, EST AFRICAN AMERICAN: 48 mL/min — AB (ref >90)
Glucose, Bld: 81 mg/dL (ref 70–99)
Potassium: 3.9 mmol/L (ref 3.5–5.1)
Sodium: 138 mmol/L (ref 135–145)
TOTAL PROTEIN: 5.7 g/dL — AB (ref 6.0–8.3)
Total Bilirubin: 0.6 mg/dL (ref 0.3–1.2)

## 2014-11-04 LAB — PROTIME-INR
INR: 1.12 (ref 0.00–1.49)
Prothrombin Time: 14.6 seconds (ref 11.6–15.2)

## 2014-11-04 LAB — URINE CULTURE
CULTURE: NO GROWTH
Colony Count: NO GROWTH

## 2014-11-04 LAB — MRSA PCR SCREENING: MRSA by PCR: POSITIVE — AB

## 2014-11-04 MED ORDER — MUPIROCIN 2 % EX OINT
1.0000 "application " | TOPICAL_OINTMENT | Freq: Two times a day (BID) | CUTANEOUS | Status: AC
  Administered 2014-11-04 – 2014-11-08 (×10): 1 via NASAL
  Filled 2014-11-04: qty 22

## 2014-11-04 MED ORDER — CHLORHEXIDINE GLUCONATE CLOTH 2 % EX PADS
6.0000 | MEDICATED_PAD | Freq: Every day | CUTANEOUS | Status: AC
  Administered 2014-11-04 – 2014-11-08 (×5): 6 via TOPICAL

## 2014-11-04 NOTE — Progress Notes (Signed)
Bladder Scan shown 150-202 cc's urine in bladder. Pt dehydrated.

## 2014-11-04 NOTE — Progress Notes (Addendum)
Patient ID: [name], female   DOB: [birth year], 81 y.o.   MRN: [id number]  [facility] PROGRESS NOTE  [name] DUK:[id number] DOB: 1922-03-25 DOA: 11/03/2014 PCP: Kevan Ny, MD  Brief narrative: 81 y.o. female with Alzheimer's disease, hypertension, osteoarthritis, chronic kidney disease, recurrent hypercalcemia in the past thought to be secondary to calcium supplements that pt was taking, presented with lethargy and generalized body pain. Patient recently presented to the ED with similar concerns. Pt lives at home and per reports, perform most of her ADL's at home.   Assessment and Plan:   Principal Problem:   Acute encephalopathy, metabolic  - secondary to dehydration, hypercalcemia, progressive FTT - mental status clear this AM   Hypercalcemia - appears to be responding to IVF - Ca is trending down, will continue IVF today and repeat BMP in AM - PTH and vit D level pending, TSH is WNL Active Problems:   Alzheimer's disease - continue donepezil  - PT/OT evaluation requested, SNF vs 24 hour supervision recommended    Essential hypertension - reasonable inpatient control    Acute on Chronic kidney disease, stage II (mild) - pre renal component - IVF and Cr is trending down    Dehydration - based on physical exam with very dry MM, hypercalcemia, hemoconcentration - stabilizing with IVF    Severe PCM - in the context of advancing dementia - nutritionist consulted    Leukocytosis - unclear etiology, UA with no bacteria noted, urine culture pending  - WBC is trending down with no ABX on board   DVT prophylaxis  Lovenox SQ while pt is in hospital  Code Status: DNR Family Communication: No family at bedside  Disposition Plan: Remains inpatient    IV Access:   Peripheral IV Procedures and diagnostic studies:    Ct Head Wo/C 11/03/2014   No acute intracranial pathology. Moderate cortical volume loss, small vessel ischemic microangiopathy.    Dg  Chest Port 1 View  11/03/2014   No acute cardiopulmonary abnormality seen.     Medical Consultants:   None Other Consultants:   Physical therapy  OT, SLP - dys II diet  Nutrition  Anti-Infectives:   None   Faye Ramsay, MD  [facility] Pager [phone]  If 7PM-7AM, please contact night-coverage [email or web address] Password [email or web address] 11/04/2014, 1:31 PM   LOS: 1 day   HPI/Subjective: No events overnight.   Objective: Filed Vitals:   11/03/14 2030 11/03/14 2103 11/04/14 0500 11/04/14 1007  BP: 102/67 129/69 113/56 117/58  Pulse: 99 84 77 82  Temp:  98.5 F (36.9 C) 98.3 F (36.8 C) 98.6 F (37 C)  TempSrc:  Oral Oral Oral  Resp: 25 18 18 16   Weight:  47.537 kg (104 lb 12.8 oz)    SpO2: 97% 95% 100% 98%   No intake or output data in the 24 hours ending 11/04/14 1331  Exam:   General:  Pt is alert, follows commands appropriately, not in acute distress  Cardiovascular: Regular rate and rhythm,  no rubs, no gallops  Respiratory: Clear to auscultation bilaterally, no wheezing, diminished breath sounds at bases   Abdomen: Soft, non tender, non distended, bowel sounds present, no guarding  Data Reviewed: Basic Metabolic Panel:  Recent Labs Lab 11/01/14 1530 11/03/14 1619 11/04/14 0630  NA 135 136 138  K 4.4 3.9 3.9  CL 97 97 107  CO2 18* 20 25  GLUCOSE 82 101* 81  BUN 14 17 17   CREATININE 1.08 1.39* 1.14*  CALCIUM 12.8* 13.1*  11.3*   Liver Function Tests:  Recent Labs Lab 11/01/14 1530 11/03/14 1619 11/04/14 0630  AST 41* 40* 25  ALT 13 15 11   ALKPHOS 72 73 51  BILITOT 0.9 1.1 0.6  PROT 7.7 7.7 5.7*  ALBUMIN 3.8 3.8 2.7*   CBC:  Recent Labs Lab 11/01/14 1530 11/03/14 2035 11/04/14 0630  WBC 6.7 14.6* 11.8*  NEUTROABS 5.1 13.3*  --   HGB 16.0* 13.5 11.9*  HCT 47.2* 40.3 36.1  MCV 80.8 79.8 79.7  PLT 245 273 273   Cardiac Enzymes:  Recent Labs Lab 11/01/14 1530  TROPONINI <0.03   Recent Results (from the past 240 hour(s))  Urine culture      Status: None   Collection Time: 11/01/14  3:09 PM  Result Value Ref Range Status   Culture NO GROWTH Performed at [facility]   Final   Report Status 11/03/2014 FINAL  Final  MRSA PCR Screening     Status: Abnormal   Collection Time: 11/04/14 12:55 AM  Result Value Ref Range Status   MRSA by PCR POSITIVE (A) NEGATIVE Final     Scheduled Meds: . aspirin EC  81 mg Oral Daily  . donepezil  5 mg Oral QHS  . enoxaparin  injection  30 mg Subcutaneous QHS   Continuous Infusions: . sodium chloride 125 mL/hr at 11/04/14 [phone]

## 2014-11-04 NOTE — Evaluation (Signed)
Clinical/Bedside Swallow Evaluation Patient Details  Name: [name] MRN: [id number] Date of Birth: [birth year]  Today's Date: 11/04/2014 Time: 0827-0835 SLP Time Calculation (min) (ACUTE ONLY): 8 min  Past Medical History:  Past Medical History  Diagnosis Date  . Urinary tract infection, site not specified   . Hematuria, unspecified   . Dysuria   . Pain in joint, site unspecified   . Alzheimer's disease   . Essential hypertension, malignant   . Osteoarthrosis, unspecified whether generalized or localized, unspecified site   . Chronic kidney disease, stage II (mild)   . Unspecified vitamin D deficiency   . Hypertension   . Stroke    Past Surgical History: History reviewed. No pertinent past surgical history. HPI:  [name] is a 81 y.o. female with Past medical history of Alzheimer's disease, hypertension, most 2 arthritis, chronic kidney disease recurrent hypercalcemia in the past. Patient presents with complaints of lethargy and generalized body pain, findnig of hypercalcemia. Pt had OP MBS due to concerns for aspiration. Recommended to consume Dys 2/thin liquids. See results below.    Assessment / Plan / Recommendation Clinical Impression  Pt demonstrates function consistent with findings from MBS completed last year. No evidence of aspiration seen, pt has slow mastication warranting dys 2 (ground ) diet. She has occasional belching and report of possible esophageal dysphagia, recommend basic aspiration precautions: upright during and 30 minutes after meals, follow solids with liquids. Oral sensitivity to cold food/drink. No SLP f/u needed.     Aspiration Risk  Mild    Diet Recommendation Dysphagia 2 (Fine chop);Thin liquid   Liquid Administration via: Cup;Straw Medication Administration: Whole meds with liquid Supervision: Staff to assist with self feeding Compensations: Slow rate;Small sips/bites Postural Changes and/or Swallow Maneuvers: Seated upright  90 degrees    Other  Recommendations Oral Care Recommendations: Oral care BID   Follow Up Recommendations  None    Frequency and Duration        Pertinent Vitals/Pain NA    SLP Swallow Goals     Swallow Study Prior Functional Status       General HPI: [name] is a 81 y.o. female with Past medical history of Alzheimer's disease, hypertension, most 2 arthritis, chronic kidney disease recurrent hypercalcemia in the past. Patient presents with complaints of lethargy and generalized body pain, findnig of hypercalcemia. Pt had OP MBS due to concerns for aspiration. Recommended to consume Dys 2/thin liquids. See results below.  Type of Study: Bedside swallow evaluation Previous Swallow Assessment: MBS 11/2013 Pt. exhibits a mild oral dysphagia with difficulty chewing solids, resulting in prolonged oral transit. Pt. swallowed soft solids and liquids without difficulty. There was no aspiration or penetration with any consistency. There is a suspected primary esophageal dysphagia, with quetion of stricture or narrowing at the GE juncture.  Diet Prior to this Study: NPO Temperature Spikes Noted: No Respiratory Status: Room air History of Recent Intubation: No Behavior/Cognition: Alert;Cooperative Oral Cavity - Dentition: Dentures, top;Dentures, bottom Self-Feeding Abilities: Needs assist Patient Positioning: Upright in chair Baseline Vocal Quality: Clear Volitional Cough: Strong Volitional Swallow: Able to elicit    Oral/Motor/Sensory Function Overall Oral Motor/Sensory Function: Appears within functional limits for tasks assessed   Ice Chips     Thin Liquid Thin Liquid: Within functional limits Presentation: Cup;Straw;Self Fed    Nectar Thick Nectar Thick Liquid: Not tested   Honey Thick Honey Thick Liquid: Not tested   Puree Puree: Within functional limits   Solid   GO  Solid: Impaired Oral Phase Impairments: Impaired mastication      Herbie [place], MA  CCC-SLP [phone]  Adriana Quinby, Katherene Ponto 11/04/2014,8:45 AM

## 2014-11-04 NOTE — Progress Notes (Signed)
OT Cancellation Note  Patient Details Name: [name] MRN: [id number] DOB: 09-Jun-1922   Cancelled Treatment:    Reason Eval/Treat Not Completed: OT screened. Pt's current D/C plan is SNF. No apparent immediate acute care OT needs, therefore will defer OT to SNF. If OT eval is needed please call Acute Rehab Dept. at [phone] or text page OT at [phone].     Benito Mccreedy OTR/L [phone]  11/04/2014, 1:17 PM

## 2014-11-04 NOTE — Progress Notes (Signed)
CRITICAL VALUE ALERT  Critical value received:  Positive MRSA PCR  Date of notification:  11/04/14  Time of notification:  0300  Critical value read back:Yes.    Nurse who received alert:  Mady Gemma, RN. Pt initiated on MRSA positive standing orders per protocol.

## 2014-11-04 NOTE — Progress Notes (Signed)
IV dc'd by pt. Unclear if by accident or due to confusion. IV site shows no signs of complications. IV team consult ordered. Will continue to monitor.

## 2014-11-04 NOTE — Evaluation (Signed)
Physical Therapy Evaluation Patient Details Name: [name] MRN: [id number] DOB: [birth year] Today's Date: 11/04/2014   History of Present Illness  Pt is a 81 y.o. female with PMH of Alzheimer's disease, hypertension, arthritis, CKD, recurrent hypercalcemia in the past. Patient presents with complaints of lethargy and generalized body pain. Patient recently presented in ER on January 1 with the complaint of lethargy, left-sided weakness and slurred speech. She also had complaints of generalized body ache and abdominal pain with poor oral intake. Workup was done in the ER and her symptoms were improved and she was at her baseline with negative CT scan and she was sent home. After going home the patient continues to complain of generalized body ache with abdominal pain. She needs to be fed at her baseline but since last few days has not been eating at all.  Clinical Impression  Pt admitted with above diagnosis. Pt currently with functional limitations due to the deficits listed below (see PT Problem List). At the time of PT eval pt was able to perform transfers with mod-max assist. Pt able to tolerate SPT to recliner with +1, however for further mobility/ambulation will require +2 assist at this time.   Pt will benefit from skilled PT to increase their independence and safety with mobility to allow discharge to the venue listed below.  Per chart review, pt requires assistance to complete ADL's and for feeding, and is currently living with her son. Pt was unable to provide any further details of PLOF. At this time it is unclear exactly what PLOF was, however it is likely that there has been a functional decline since PTA.      Follow Up Recommendations SNF;Supervision/Assistance - 24 hour    Equipment Recommendations  Rolling walker with 5" wheels    Recommendations for Other Services       Precautions / Restrictions Precautions Precautions: Fall Restrictions Weight Bearing  Restrictions: No      Mobility  Bed Mobility Overal bed mobility: Needs Assistance Bed Mobility: Rolling;Sidelying to Sit Rolling: Min assist Sidelying to sit: Mod assist       General bed mobility comments: Bed pad used to assist with rolling and scooting. Hand-over-hand assist required for pt to reach for bed rail. Unable to really hold tight enough for assist. Heavy mod assist to transition to full sitting position EOB.   Transfers Overall transfer level: Needs assistance Equipment used: 1 person hand held assist Transfers: Sit to/from [facility] Sit to Stand: Max assist Stand pivot transfers: Max assist       General transfer comment: Pt required max assist for face-to-face transfer to standing, and then to pivot around to recliner chair. Pt holding onto therapist's arms for support and was able to take minimal pivotal steps.   Ambulation/Gait             General Gait Details: Deferred due to pt fatigue and need for +2 assist for further mobility.   Stairs            Wheelchair Mobility    Modified Rankin (Stroke Patients Only)       Balance Overall balance assessment: Needs assistance Sitting-balance support: Feet supported;No upper extremity supported Sitting balance-Leahy Scale: Fair     Standing balance support: Bilateral upper extremity supported Standing balance-Leahy Scale: Poor                               Pertinent Vitals/Pain  Pain Assessment: No/denies pain    Home Living Family/patient expects to be discharged to:: Private residence Living Arrangements: Children Available Help at Discharge: Personal care attendant;Available 24 hours/day Type of Home: House       Home Layout: One level Home Equipment: Cane - single point;Walker - standard;Bedside commode;Shower seat;Grab bars - tub/shower;Wheelchair - manual Additional Comments: Home information taken from prior admission as pt is a poor historian. RN  confirms that pt lives at home with son.     Prior Function Level of Independence: Needs assistance   Gait / Transfers Assistance Needed: Pt states she uses a walker all the time.   ADL's / Homemaking Assistance Needed: Dependent for ADL's and feeding per chart review        Hand Dominance   Dominant Hand: Right    Extremity/Trunk Assessment   Upper Extremity Assessment: Defer to OT evaluation;Generalized weakness           Lower Extremity Assessment: Generalized weakness      Cervical / Trunk Assessment: Kyphotic  Communication   Communication: No difficulties  Cognition Arousal/Alertness: Lethargic Behavior During Therapy: Flat affect Overall Cognitive Status: No family/caregiver present to determine baseline cognitive functioning                      General Comments      Exercises        Assessment/Plan    PT Assessment Patient needs continued PT services  PT Diagnosis Difficulty walking;Generalized weakness   PT Problem List Decreased strength;Decreased range of motion;Decreased activity tolerance;Decreased balance;Decreased mobility;Decreased knowledge of use of DME;Decreased safety awareness;Decreased knowledge of precautions  PT Treatment Interventions DME instruction;Gait training;Stair training;Functional mobility training;Therapeutic activities;Therapeutic exercise;Neuromuscular re-education;Patient/family education   PT Goals (Current goals can be found in the Care Plan section) Acute Rehab PT Goals Patient Stated Goal: Pt did not state goals at this time.  PT Goal Formulation: With patient Time For Goal Achievement: 11/18/14 Potential to Achieve Goals: Fair    Frequency Min 2X/week   Barriers to discharge        Co-evaluation               End of Session Equipment Utilized During Treatment: Gait belt Activity Tolerance: Patient limited by fatigue Patient left: in chair;with chair alarm set;with call bell/phone within  reach Nurse Communication: Mobility status         Time: 0806-0828 PT Time Calculation (min) (ACUTE ONLY): 22 min   Charges:   PT Evaluation $Initial PT Evaluation Tier I: 1 Procedure PT Treatments $Therapeutic Activity: 8-22 mins   PT G Codes:        Rolinda Roan December 03, 2014, 11:08 AM   Rolinda Roan, PT, DPT Acute Rehabilitation Services Pager: [phone]

## 2014-11-04 NOTE — Progress Notes (Signed)
Pt has had no visible urine. Dr. Doyle Askew called do bladder scan

## 2014-11-05 DIAGNOSIS — E43 Unspecified severe protein-calorie malnutrition: Secondary | ICD-10-CM | POA: Insufficient documentation

## 2014-11-05 LAB — GLUCOSE, CAPILLARY
GLUCOSE-CAPILLARY: 65 mg/dL — AB (ref 70–99)
GLUCOSE-CAPILLARY: 77 mg/dL (ref 70–99)
GLUCOSE-CAPILLARY: 88 mg/dL (ref 70–99)
GLUCOSE-CAPILLARY: 97 mg/dL (ref 70–99)
Glucose-Capillary: 58 mg/dL — ABNORMAL LOW (ref 70–99)
Glucose-Capillary: 54 mg/dL — ABNORMAL LOW (ref 70–99)

## 2014-11-05 LAB — BASIC METABOLIC PANEL
Anion gap: 6 (ref 5–15)
BUN: 12 mg/dL (ref 6–23)
CO2: 26 mmol/L (ref 19–32)
Calcium: 11.1 mg/dL — ABNORMAL HIGH (ref 8.4–10.5)
Chloride: 108 mEq/L (ref 96–112)
Creatinine, Ser: 0.85 mg/dL (ref 0.50–1.10)
GFR calc Af Amer: 68 mL/min — ABNORMAL LOW (ref >90)
GFR calc non Af Amer: 59 mL/min — ABNORMAL LOW (ref >90)
GLUCOSE: 96 mg/dL (ref 70–99)
POTASSIUM: 3.3 mmol/L — AB (ref 3.5–5.1)
Sodium: 140 mmol/L (ref 135–145)

## 2014-11-05 LAB — CBC
HCT: 37.9 % (ref 36.0–46.0)
HEMOGLOBIN: 12.3 g/dL (ref 12.0–15.0)
MCH: 25.8 pg — ABNORMAL LOW (ref 26.0–34.0)
MCHC: 32.5 g/dL (ref 30.0–36.0)
MCV: 79.6 fL (ref 78.0–100.0)
Platelets: 288 10*3/uL (ref 150–400)
RBC: 4.76 MIL/uL (ref 3.87–5.11)
RDW: 13.8 % (ref 11.5–15.5)
WBC: 4.9 10*3/uL (ref 4.0–10.5)

## 2014-11-05 LAB — PARATHYROID HORMONE, INTACT (NO CA): PTH: 79 pg/mL — AB (ref 14–64)

## 2014-11-05 MED ORDER — ENSURE COMPLETE PO LIQD
237.0000 mL | Freq: Two times a day (BID) | ORAL | Status: DC
  Administered 2014-11-05 – 2014-11-08 (×6): 237 mL via ORAL

## 2014-11-05 MED ORDER — GLUCOSE 40 % PO GEL
ORAL | Status: AC
  Administered 2014-11-05: 37.5 g
  Filled 2014-11-05: qty 1

## 2014-11-05 NOTE — Progress Notes (Addendum)
Hypoglycemic Event  CBG: 58  Treatment: 1 tube instant glucose  Symptoms: None  Follow-up CBG: Time: 1408  CBG Result: 77    Possible Reasons for Event: Inadequate meal intake  Comments/MD notified:MD aware     [name] A  Remember to initiate Hypoglycemia Order Set & complete

## 2014-11-05 NOTE — Progress Notes (Signed)
Patient ID: [name], female   DOB: [birth year], 81 y.o.   MRN: [id number]  [facility] PROGRESS NOTE  [name] TKZ:[id number] DOB: [birth year] DOA: 11/03/2014 PCP: Kevan Ny, MD  Brief narrative: 81 y.o. female with Alzheimer's disease, hypertension, osteoarthritis, chronic kidney disease, recurrent hypercalcemia in the past thought to be secondary to calcium supplements that pt was taking, presented with lethargy and generalized body pain. Patient recently presented to the ED with similar concerns. Pt lives at home and per reports, perform most of her ADL's at home.   Assessment and Plan:   Principal Problem:  Acute encephalopathy, metabolic  - secondary to dehydration, hypercalcemia, progressive FTT - mental status clear this AM  Hypercalcemia - appears to be responding to IVF - Ca is trending down, will continue IVF today, repeat BMP pending this MA - PTH and vit D level pending, TSH is WNL Active Problems:  Alzheimer's disease - continue donepezil  - PT/OT evaluation requested, SNF vs 24 hour supervision recommended  - will discuss with family    Essential hypertension - reasonable inpatient control   Acute on Chronic kidney disease, stage II (mild) - pre renal component - continuing IVF, BMP pending this AM   Dehydration - based on physical exam with very dry MM, hypercalcemia, hemoconcentration - stabilizing with IVF   Severe PCM - in the context of advancing dementia - continue with dys II diet   Leukocytosis - unclear etiology, UA with no bacteria noted, urine culture pending  - WBC is trending down with no ABX on board  - CBC pending this AM  DVT prophylaxis  Lovenox SQ while pt is in hospital  Code Status: DNR Family Communication: No family at bedside  Disposition Plan: Remains inpatient, SNF vs home with [facility] PT   IV Access:    Peripheral IV Procedures and diagnostic studies:    Ct Head Wo/C 11/03/2014 No acute  intracranial pathology. Moderate cortical volume loss, small vessel ischemic microangiopathy.   Dg Chest Port 1 View 11/03/2014 No acute cardiopulmonary abnormality seen.  Medical Consultants:    None Other Consultants:    Physical therapy   OT, SLP - dys II diet   Nutrition  Anti-Infectives:    None  Faye Ramsay, MD  [facility] Pager [phone]  If 7PM-7AM, please contact night-coverage [email or web address] Password [email or web address] 11/05/2014, 10:44 AM   LOS: 2 days   HPI/Subjective: No events overnight.   Objective: Filed Vitals:   11/04/14 1825 11/04/14 2130 11/05/14 0532 11/05/14 1044  BP: 113/59 115/55 104/64 134/77  Pulse: 102 60 82 68  Temp: 98.3 F (36.8 C) 98.7 F (37.1 C) 98 F (36.7 C) 98.1 F (36.7 C)  TempSrc: Oral Oral Oral Oral  Resp: 18 18 18 18   Weight:  47.537 kg (104 lb 12.8 oz)    SpO2: 96% 99% 95% 98%    Intake/Output Summary (Last 24 hours) at 11/05/14 1044 Last data filed at 11/05/14 0544  Gross per 24 hour  Intake    240 ml  Output      1 ml  Net    239 ml    Exam:   General:  Pt is alert, not in acute distress  Cardiovascular: Regular rate and rhythm, no rubs, no gallops  Respiratory: Clear to auscultation bilaterally, no wheezing, diminished breath sounds at bases   Abdomen: Soft, non tender, non distended, bowel sounds present, no guarding  Data Reviewed: Basic Metabolic Panel:  Recent Labs Lab 11/01/14 1530 11/03/14 1619 11/04/14  0630  NA 135 136 138  K 4.4 3.9 3.9  CL 97 97 107  CO2 18* 20 25  GLUCOSE 82 101* 81  BUN 14 17 17   CREATININE 1.08 1.39* 1.14*  CALCIUM 12.8* 13.1* 11.3*   Liver Function Tests:  Recent Labs Lab 11/01/14 1530 11/03/14 1619 11/04/14 0630  AST 41* 40* 25  ALT 13 15 11   ALKPHOS 72 73 51  BILITOT 0.9 1.1 0.6  PROT 7.7 7.7 5.7*  ALBUMIN 3.8 3.8 2.7*   CBC:  Recent Labs Lab 11/01/14 1530 11/03/14 2035 11/04/14 0630  WBC 6.7 14.6* 11.8*  NEUTROABS 5.1 13.3*  --   HGB 16.0*  13.5 11.9*  HCT 47.2* 40.3 36.1  MCV 80.8 79.8 79.7  PLT 245 273 273   Cardiac Enzymes:  Recent Labs Lab 11/01/14 1530  TROPONINI <0.03     Recent Results (from the past 240 hour(s))  Urine culture     Status: None   Collection Time: 11/01/14  3:09 PM  Result Value Ref Range Status   Specimen Description URINE, CATHETERIZED  Final   Special Requests NONE  Final   Colony Count NO GROWTH Performed at [facility]   Final   Culture NO GROWTH Performed at [facility]   Final   Report Status 11/03/2014 FINAL  Final  Urine culture     Status: None   Collection Time: 11/03/14  8:01 PM  Result Value Ref Range Status   Specimen Description URINE, RANDOM  Final   Special Requests NONE  Final   Colony Count NO GROWTH Performed at [facility]   Final   Culture NO GROWTH Performed at [facility]   Final   Report Status 11/04/2014 FINAL  Final  MRSA PCR Screening     Status: Abnormal   Collection Time: 11/04/14 12:55 AM  Result Value Ref Range Status   MRSA by PCR POSITIVE (A) NEGATIVE Final    Comment:        The GeneXpert MRSA Assay (FDA approved for NASAL specimens only), is one component of a comprehensive MRSA colonization surveillance program. It is not intended to diagnose MRSA infection nor to guide or monitor treatment for MRSA infections. RESULT CALLED TO, READ BACK BY AND VERIFIED WITH: MOORE,K RN [phone] 11/04/14 MITCHELL,L      Scheduled Meds: . aspirin EC  81 mg Oral Daily  . Chlorhexidine Gluconate Cloth  6 each Topical Q0600  . donepezil  5 mg Oral QHS  . enoxaparin (LOVENOX) injection  30 mg Subcutaneous QHS  . mupirocin ointment  1 application Nasal BID  . sodium chloride  3 mL Intravenous Q12H   Continuous Infusions: . sodium chloride 125 mL/hr at 11/04/14 2332

## 2014-11-05 NOTE — Progress Notes (Signed)
INITIAL NUTRITION ASSESSMENT  Pt meets criteria for SEVERE MALNUTRITION in the context of chronic illness as evidenced by a 14% weight loss in 7 months and severe muscle mass loss.  DOCUMENTATION CODES Per approved criteria  -Severe malnutrition in the context of chronic illness -Underweight   INTERVENTION: Provide Ensure Complete po BID, each supplement provides 350 kcal and 13 grams of protein.  Encourage adequate PO intake.  NUTRITION DIAGNOSIS: Inadequate oral intake related to lethargy, decreased appetite as evidenced by meal completion of 5%.   Goal: Pt to meet >/= 90% of their estimated nutrition needs   Monitor:  PO intake, weight trends, labs, I/O's  Reason for Assessment: MD consult for assessment of nutrition requirements/status  81 y.o. female  Admitting Dx: Hypercalcemia  ASSESSMENT: Pt with Alzheimer's disease, hypertension, osteoarthritis, chronic kidney disease, recurrent hypercalcemia in the past thought to be secondary to calcium supplements that pt was taking, presented with lethargy and generalized body pain.  Pt reports her appetite is just "ok". Meal completion has been 5%. Pt reports PTA she usually drinks 1-2 nutritional supplements drinks a day. RD to order Ensure. Pt reports she has been trying to eat 3 meals a day. Pt unable to quantify the amount of food she usually eats. Per Epic weight records, pt with a 14% weight loss in 7 months. Pt was encouraged to eat her food at meals and to drink her supplements.   Nutrition Focused Physical Exam:  Subcutaneous Fat:  Orbital Region: N/A Upper Arm Region: Moderate depletion Thoracic and Lumbar Region: N/A  Muscle:  Temple Region: N/A Clavicle Bone Region: Severe depletion Clavicle and Acromion Bone Region: Severe depletion Scapular Bone Region: N/A Dorsal Hand: N/A Patellar Region: WNL Anterior Thigh Region: Moderate depletion Posterior Calf Region: WNL  Edema: none  Labs: Low GFR. High  creatinine and calcium.   Height: Ht Readings from Last 1 Encounters:  04/12/14 5\' 4"  (1.626 m)    Weight: Wt Readings from Last 1 Encounters:  11/04/14 104 lb 12.8 oz (47.537 kg)    Ideal Body Weight: 120 lbs  % Ideal Body Weight: 87%  Wt Readings from Last 10 Encounters:  11/04/14 104 lb 12.8 oz (47.537 kg)  04/11/14 121 lb 0.5 oz (54.9 kg)  02/01/11 142 lb (64.411 kg)    Usual Body Weight: 120 lbs  % Usual Body Weight: 87%  BMI:  Body mass index is 17.98 kg/(m^2). Underweight  Estimated Nutritional Needs: Kcal: 1600-1800 Protein: 65-75 grams Fluid: 1.6 - 1.8 L/day  Skin: intact  Diet Order: DIET DYS 2  EDUCATION NEEDS: -Education not appropriate at this time   Intake/Output Summary (Last 24 hours) at 11/05/14 0928 Last data filed at 11/05/14 0544  Gross per 24 hour  Intake    240 ml  Output      1 ml  Net    239 ml    Last BM: 1/1 Labs:   Recent Labs Lab 11/01/14 1530 11/03/14 1619 11/04/14 0630  NA 135 136 138  K 4.4 3.9 3.9  CL 97 97 107  CO2 18* 20 25  BUN 14 17 17   CREATININE 1.08 1.39* 1.14*  CALCIUM 12.8* 13.1* 11.3*  GLUCOSE 82 101* 81    CBG (last 3)  No results for input(s): GLUCAP in the last 72 hours.  Scheduled Meds: . aspirin EC  81 mg Oral Daily  . Chlorhexidine Gluconate Cloth  6 each Topical Q0600  . donepezil  5 mg Oral QHS  . enoxaparin (LOVENOX) injection  30 mg Subcutaneous QHS  . mupirocin ointment  1 application Nasal BID  . sodium chloride  3 mL Intravenous Q12H    Continuous Infusions: . sodium chloride 125 mL/hr at 11/04/14 2332    Past Medical History  Diagnosis Date  . Urinary tract infection, site not specified   . Hematuria, unspecified   . Dysuria   . Pain in joint, site unspecified   . Alzheimer's disease   . Essential hypertension, malignant   . Osteoarthrosis, unspecified whether generalized or localized, unspecified site   . Chronic kidney disease, stage II (mild)   . Unspecified vitamin  D deficiency   . Hypertension   . Stroke     History reviewed. No pertinent past surgical history.  Kallie Locks, MS, RD, LDN Pager # [phone] After hours/ weekend pager # [phone]

## 2014-11-06 DIAGNOSIS — R059 Cough, unspecified: Secondary | ICD-10-CM | POA: Insufficient documentation

## 2014-11-06 DIAGNOSIS — N179 Acute kidney failure, unspecified: Secondary | ICD-10-CM

## 2014-11-06 DIAGNOSIS — G9340 Encephalopathy, unspecified: Secondary | ICD-10-CM

## 2014-11-06 DIAGNOSIS — R05 Cough: Secondary | ICD-10-CM

## 2014-11-06 DIAGNOSIS — E860 Dehydration: Secondary | ICD-10-CM | POA: Insufficient documentation

## 2014-11-06 LAB — BASIC METABOLIC PANEL
ANION GAP: 8 (ref 5–15)
BUN: 9 mg/dL (ref 6–23)
CALCIUM: 10.8 mg/dL — AB (ref 8.4–10.5)
CO2: 22 mmol/L (ref 19–32)
CREATININE: 0.85 mg/dL (ref 0.50–1.10)
Chloride: 111 mEq/L (ref 96–112)
GFR calc Af Amer: 68 mL/min — ABNORMAL LOW (ref >90)
GFR calc non Af Amer: 59 mL/min — ABNORMAL LOW (ref >90)
Glucose, Bld: 73 mg/dL (ref 70–99)
Potassium: 3.3 mmol/L — ABNORMAL LOW (ref 3.5–5.1)
Sodium: 141 mmol/L (ref 135–145)

## 2014-11-06 LAB — CBC
HEMATOCRIT: 40.6 % (ref 36.0–46.0)
Hemoglobin: 13.3 g/dL (ref 12.0–15.0)
MCH: 26.1 pg (ref 26.0–34.0)
MCHC: 32.8 g/dL (ref 30.0–36.0)
MCV: 79.6 fL (ref 78.0–100.0)
Platelets: 268 10*3/uL (ref 150–400)
RBC: 5.1 MIL/uL (ref 3.87–5.11)
RDW: 13.8 % (ref 11.5–15.5)
WBC: 5.6 10*3/uL (ref 4.0–10.5)

## 2014-11-06 MED ORDER — POTASSIUM CHLORIDE CRYS ER 20 MEQ PO TBCR
40.0000 meq | EXTENDED_RELEASE_TABLET | Freq: Four times a day (QID) | ORAL | Status: AC
  Administered 2014-11-06 (×2): 40 meq via ORAL
  Filled 2014-11-06 (×2): qty 2

## 2014-11-06 NOTE — Progress Notes (Signed)
Physical Therapy Treatment Patient Details Name: [name] MRN: [id number] DOB: April 30, 1922 Today's Date: 11/06/2014    History of Present Illness Pt is a 81 y.o. female with PMH of Alzheimer's disease, hypertension, arthritis, CKD, recurrent hypercalcemia in the past. Patient presents with complaints of lethargy and generalized body pain. Patient recently presented in ER on January 1 with the complaint of lethargy, left-sided weakness and slurred speech. She also had complaints of generalized body ache and abdominal pain with poor oral intake. Workup was done in the ER and her symptoms were improved and she was at her baseline with negative CT scan and she was sent home. After going home the patient continues to complain of generalized body ache with abdominal pain. She needs to be fed at her baseline but since last few days has not been eating at all.    PT Comments    Pt progressing towards physical therapy goals. Was able to perform pre-gait activity at edge of chair and feel that pt is ready to progress to ambulation with RW next session. Continue to recommend 24 hour assist and continued skilled therapy at the SNF level at d/c.  Follow Up Recommendations  SNF;Supervision/Assistance - 24 hour     Equipment Recommendations  Rolling walker with 5" wheels    Recommendations for Other Services       Precautions / Restrictions Precautions Precautions: Fall Restrictions Weight Bearing Restrictions: No    Mobility  Bed Mobility Overal bed mobility: Needs Assistance Bed Mobility: Rolling;Sidelying to Sit Rolling: Min assist Sidelying to sit: Mod assist       General bed mobility comments: Bed pad use for bed mobility. Pt appeared to have a difficult time sequencing this session and required increased VC's to get to EOB.   Transfers Overall transfer level: Needs assistance Equipment used: 2 person hand held assist Transfers: Sit to/from [facility] Sit  to Stand: Mod assist;+2 physical assistance Stand pivot transfers: Mod assist;+2 physical assistance       General transfer comment: Pt was able to power-up to standing position with +2 assist. Pt was able to take pivotal steps around to the chair with bilateral support.   Ambulation/Gait Ambulation/Gait assistance: +2 physical assistance           General Gait Details: Pt was able to complete pre-gait activity with +2 assist for safety and support at edge of chair. Pt completed weight shifting x1 minute, standing marching in place x5 and static stand with focus on posture x30".   Stairs            Wheelchair Mobility    Modified Rankin (Stroke Patients Only)       Balance Overall balance assessment: Needs assistance Sitting-balance support: Feet supported;No upper extremity supported Sitting balance-Leahy Scale: Fair     Standing balance support: Bilateral upper extremity supported Standing balance-Leahy Scale: Poor                      Cognition Arousal/Alertness: Lethargic Behavior During Therapy: Flat affect Overall Cognitive Status: No family/caregiver present to determine baseline cognitive functioning                      Exercises      General Comments        Pertinent Vitals/Pain Pain Assessment: No/denies pain    Home Living  Prior Function            PT Goals (current goals can now be found in the care plan section) Acute Rehab PT Goals Patient Stated Goal: Pt did not state goals at this time.  PT Goal Formulation: With patient Time For Goal Achievement: 11/18/14 Potential to Achieve Goals: Fair Progress towards PT goals: Progressing toward goals    Frequency  Min 2X/week    PT Plan Current plan remains appropriate    Co-evaluation             End of Session Equipment Utilized During Treatment: Gait belt Activity Tolerance: Patient limited by fatigue Patient left: in  chair;with chair alarm set;with call bell/phone within reach     Time: 0939-1000 PT Time Calculation (min) (ACUTE ONLY): 21 min  Charges:  $Therapeutic Activity: 8-22 mins                    G Codes:      Rolinda Roan 12/03/2014, 10:49 AM   Rolinda Roan, PT, DPT Acute Rehabilitation Services Pager: [phone]

## 2014-11-06 NOTE — Progress Notes (Signed)
Patient ID: [name], female   DOB: [birth year], 81 y.o.   MRN: [id number]  [facility] PROGRESS NOTE  KILYNN FITZSIMMONS UXN:[id number] DOB: Jul 15, 1922 DOA: 11/03/2014 PCP: [name] ERIC, MD   HPI/Subjective: No events overnight.   Brief narrative: 81 y.o. female with Alzheimer's disease, hypertension, osteoarthritis, chronic kidney disease, recurrent hypercalcemia in the past thought to be secondary to calcium supplements that pt was taking, presented with lethargy and generalized body pain. Patient recently presented to the ED with similar concerns. Pt lives at home and per reports, perform most of her ADL's at home.   Assessment and Plan:   Principal Problem:   Hypercalcemia Active Problems:   Alzheimer's disease   Essential hypertension, malignant   Chronic kidney disease, stage II (mild)   Hypertension   Protein-calorie malnutrition, severe   Acute encephalopathy, metabolic  -Secondary to dehydration, hypercalcemia, progressive FTT -Patient is awake and alert but only oriented to self.  Hypercalcemia -Appears to be responding to IVF, continue IV fluids, no need for calcitonin or bisphosphonate for now. -Ca is trending down, will continue IVF today and repeat BMP in AM -PTH and vit D level pending, TSH is WNL. -Hypercalcemia is likely secondary to right-sided breast cancer.  Breast cancer -Right-sided breast cancer diagnosed in June 2015. -Spoke with son family aware about the diagnosis since June 2015. -The plan formulated with her per PCP Dr. [name] is palliative plan, secondary to patient's advanced age.  Alzheimer's disease - Continue donepezil  - PT/OT evaluation requested, SNF vs 24 hour supervision recommended   Essential hypertension - reasonable inpatient control   Acute on Chronic kidney disease, stage II (mild) - pre renal component - IVF and Cr is trending down   Dehydration - based on physical exam with very dry MM, hypercalcemia,  hemoconcentration - stabilizing with IVF   Severe PCM - in the context of advancing dementia - nutritionist consulted   Leukocytosis - unclear etiology, UA with no bacteria noted, urine culture pending  - WBC is trending down with no ABX on board    DVT prophylaxis  Lovenox SQ while pt is in hospital  Code Status: DNR Family Communication: No family at bedside  Disposition Plan: Remains inpatient    IV Access:   Peripheral IV Procedures and diagnostic studies:    Ct Head Wo/C 11/03/2014   No acute intracranial pathology. Moderate cortical volume loss, small vessel ischemic microangiopathy.    Dg Chest Port 1 View  11/03/2014   No acute cardiopulmonary abnormality seen.     Medical Consultants:   None Other Consultants:   Physical therapy  OT, SLP - dys II diet  Nutrition  Anti-Infectives:   None   Josslyn Ciolek A, MD  [place] Pager [phone]  If 7PM-7AM, please contact night-coverage [email or web address] Password [email or web address] 11/06/2014, 5:15 PM   LOS: 3 days     Objective: Filed Vitals:   11/05/14 1637 11/05/14 2046 11/06/14 0512 11/06/14 1000  BP: 121/69 125/57 119/55 157/77  Pulse: 75 71 73 79  Temp: 97.8 F (36.6 C) 98 F (36.7 C) 98 F (36.7 C) 98.2 F (36.8 C)  TempSrc: Oral Oral Oral Oral  Resp: 17 17 17 16   Weight:  47.537 kg (104 lb 12.8 oz)    SpO2: 99% 95% 95% 95%    Intake/Output Summary (Last 24 hours) at 11/06/14 1715 Last data filed at 11/06/14 1650  Gross per 24 hour  Intake   1047 ml  Output      0 ml  Net   1047 ml    Exam:   General:  Pt is alert, follows commands appropriately, not in acute distress  Cardiovascular: Regular rate and rhythm,  no rubs, no gallops  Respiratory: Clear to auscultation bilaterally, no wheezing, diminished breath sounds at bases   Abdomen: Soft, non tender, non distended, bowel sounds present, no guarding  Data Reviewed: Basic Metabolic Panel:  Recent Labs Lab 11/01/14 1530 11/03/14 1619 11/04/14 0630  11/05/14 1649 11/06/14 0742  NA 135 136 138 140 141  K 4.4 3.9 3.9 3.3* 3.3*  CL 97 97 107 108 111  CO2 18* 20 25 26 22   GLUCOSE 82 101* 81 96 73  BUN 14 17 17 12 9   CREATININE 1.08 1.39* 1.14* 0.85 0.85  CALCIUM 12.8* 13.1* 11.3* 11.1* 10.8*   Liver Function Tests:  Recent Labs Lab 11/01/14 1530 11/03/14 1619 11/04/14 0630  AST 41* 40* 25  ALT 13 15 11   ALKPHOS 72 73 51  BILITOT 0.9 1.1 0.6  PROT 7.7 7.7 5.7*  ALBUMIN 3.8 3.8 2.7*   CBC:  Recent Labs Lab 11/01/14 1530 11/03/14 2035 11/04/14 0630 11/05/14 1649 11/06/14 0742  WBC 6.7 14.6* 11.8* 4.9 5.6  NEUTROABS 5.1 13.3*  --   --   --   HGB 16.0* 13.5 11.9* 12.3 13.3  HCT 47.2* 40.3 36.1 37.9 40.6  MCV 80.8 79.8 79.7 79.6 79.6  PLT 245 273 273 288 268   Cardiac Enzymes:  Recent Labs Lab 11/01/14 1530  TROPONINI <0.03   Recent Results (from the past 240 hour(s))  Urine culture     Status: None   Collection Time: 11/01/14  3:09 PM  Result Value Ref Range Status   Culture NO GROWTH Performed at [facility]   Final   Report Status 11/03/2014 FINAL  Final  MRSA PCR Screening     Status: Abnormal   Collection Time: 11/04/14 12:55 AM  Result Value Ref Range Status   MRSA by PCR POSITIVE (A) NEGATIVE Final     Scheduled Meds: . aspirin EC  81 mg Oral Daily  . donepezil  5 mg Oral QHS  . enoxaparin  injection  30 mg Subcutaneous QHS   Continuous Infusions: . sodium chloride 75 mL/hr at 11/06/14 0139

## 2014-11-07 DIAGNOSIS — E43 Unspecified severe protein-calorie malnutrition: Secondary | ICD-10-CM

## 2014-11-07 LAB — RENAL FUNCTION PANEL
Albumin: 2.8 g/dL — ABNORMAL LOW (ref 3.5–5.2)
Anion gap: 7 (ref 5–15)
BUN: 8 mg/dL (ref 6–23)
CALCIUM: 11.2 mg/dL — AB (ref 8.4–10.5)
CO2: 21 mmol/L (ref 19–32)
Chloride: 113 mEq/L — ABNORMAL HIGH (ref 96–112)
Creatinine, Ser: 0.84 mg/dL (ref 0.50–1.10)
GFR calc non Af Amer: 59 mL/min — ABNORMAL LOW (ref >90)
GFR, EST AFRICAN AMERICAN: 69 mL/min — AB (ref >90)
GLUCOSE: 75 mg/dL (ref 70–99)
Phosphorus: 1.6 mg/dL — ABNORMAL LOW (ref 2.3–4.6)
Potassium: 4.2 mmol/L (ref 3.5–5.1)
Sodium: 141 mmol/L (ref 135–145)

## 2014-11-07 LAB — VITAMIN D 1,25 DIHYDROXY
VITAMIN D 1, 25 (OH) TOTAL: 29 pg/mL (ref 18–72)
Vitamin D2 1, 25 (OH)2: <8 pg/mL
Vitamin D3 1, 25 (OH)2: 29 pg/mL

## 2014-11-07 LAB — GLUCOSE, CAPILLARY
GLUCOSE-CAPILLARY: 97 mg/dL (ref 70–99)
Glucose-Capillary: 94 mg/dL (ref 70–99)

## 2014-11-07 MED ORDER — K PHOS MONO-SOD PHOS DI & MONO 155-852-130 MG PO TABS
500.0000 mg | ORAL_TABLET | Freq: Four times a day (QID) | ORAL | Status: DC
  Administered 2014-11-07 – 2014-11-08 (×5): 500 mg via ORAL
  Filled 2014-11-07 (×7): qty 2

## 2014-11-07 MED ORDER — FUROSEMIDE 10 MG/ML IJ SOLN
40.0000 mg | Freq: Once | INTRAMUSCULAR | Status: AC
  Administered 2014-11-07: 40 mg via INTRAVENOUS
  Filled 2014-11-07: qty 4

## 2014-11-07 MED ORDER — SODIUM CHLORIDE 0.9 % IV SOLN
60.0000 mg | Freq: Once | INTRAVENOUS | Status: AC
  Administered 2014-11-07: 60 mg via INTRAVENOUS
  Filled 2014-11-07: qty 20

## 2014-11-07 MED ORDER — MAGNESIUM SULFATE 2 GM/50ML IV SOLN
2.0000 g | Freq: Once | INTRAVENOUS | Status: AC
  Administered 2014-11-07: 2 g via INTRAVENOUS
  Filled 2014-11-07: qty 50

## 2014-11-07 NOTE — Progress Notes (Signed)
Patient ID: [name], female   DOB: April 10, 1922, 81 y.o.   MRN: [id number]  [facility] PROGRESS NOTE  [name] MEQ:[id number] DOB: 08-Aug-1922 DOA: 11/03/2014 PCP: [name] ERIC, MD   HPI/Subjective: More awake and alert, calcium still 11.3, corrected is 12.2.  Brief narrative: 81 y.o. female with Alzheimer's disease, hypertension, osteoarthritis, chronic kidney disease, recurrent hypercalcemia in the past thought to be secondary to calcium supplements that pt was taking, presented with lethargy and generalized body pain. Patient recently presented to the ED with similar concerns. Pt lives at home and per reports, perform most of her ADL's at home.   Assessment and Plan:   Principal Problem:   Hypercalcemia Active Problems:   Alzheimer's disease   Essential hypertension, malignant   Chronic kidney disease, stage II (mild)   Hypertension   Protein-calorie malnutrition, severe   Acute encephalopathy   Acute renal failure syndrome   Cough   Dehydration   Acute encephalopathy, metabolic  -Secondary to dehydration, hypercalcemia, progressive FTT -Patient is awake and alert but only oriented to self. -Feels better today, PT/OT recommended skilled nursing.  Hypercalcemia -Appears to be responding to IVF, continue IV fluids, no need for calcitonin or bisphosphonate for now. -Hypercalcemia is likely secondary to right-sided breast cancer. -Calcium was trending down initially, today back at 11.2 and corrected as 12.2. -A. fib given 1 dose of pamidronate, increase IV fluids and gave 1 dose of Lasix, check calcium in a.m.  Breast cancer -Right-sided breast cancer diagnosed in June 2015. -Spoke with son [name]) family is aware about the diagnosis since June 2015. -Family chose the palliative route for cancer treatment because of the advanced age.  Alzheimer's disease - Continue donepezil  - PT/OT evaluation requested, SNF vs 24 hour supervision recommended    Essential hypertension - reasonable inpatient control   Acute on Chronic kidney disease, stage II (mild) - pre renal component - Creatinine is back to normal with IV fluids.  Dehydration - based on physical exam with very dry, hypercalcemia, hemoconcentration - stabilizing with IVF   Severe PCM - in the context of advancing dementia - nutritionist consulted   Leukocytosis - unclear etiology, UA with no bacteria noted, urine culture pending  - WBC is trending down with no ABX on board    DVT prophylaxis  Lovenox SQ while pt is in hospital  Code Status: DNR Family Communication: No family at bedside  Disposition Plan: Remains inpatient    IV Access:   Peripheral IV Procedures and diagnostic studies:    Ct Head Wo/C 11/03/2014   No acute intracranial pathology. Moderate cortical volume loss, small vessel ischemic microangiopathy.    Dg Chest Port 1 View  11/03/2014   No acute cardiopulmonary abnormality seen.     Medical Consultants:   None Other Consultants:   Physical therapy  OT, SLP - dys II diet  Nutrition  Anti-Infectives:   None   Marygrace Sandoval A, MD  [place] Pager [phone]  If 7PM-7AM, please contact night-coverage [email or web address] Password [email or web address] 11/07/2014, 1:39 PM   LOS: 4 days     Objective: Filed Vitals:   11/06/14 1945 11/06/14 2100 11/07/14 0500 11/07/14 0950  BP:  161/67 153/83 141/99  Pulse:  66 77 96  Temp:  98.1 F (36.7 C) 97.8 F (36.6 C) 98.5 F (36.9 C)  TempSrc:  Oral Axillary Oral  Resp:  18 18 20   Height: 5\' 2"  (1.575 m)     Weight:      SpO2:  100%  95% 94%    Intake/Output Summary (Last 24 hours) at 11/07/14 1339 Last data filed at 11/07/14 0900  Gross per 24 hour  Intake    990 ml  Output      0 ml  Net    990 ml    Exam:   General:  Pt is alert, follows commands appropriately, not in acute distress  Cardiovascular: Regular rate and rhythm,  no rubs, no gallops  Respiratory: Clear to auscultation bilaterally, no  wheezing, diminished breath sounds at bases   Abdomen: Soft, non tender, non distended, bowel sounds present, no guarding  Data Reviewed: Basic Metabolic Panel:  Recent Labs Lab 11/03/14 1619 11/04/14 0630 11/05/14 1649 11/06/14 0742 11/07/14 0710  NA 136 138 140 141 141  K 3.9 3.9 3.3* 3.3* 4.2  CL 97 107 108 111 113*  CO2 20 25 26 22 21   GLUCOSE 101* 81 96 73 75  BUN 17 17 12 9 8   CREATININE 1.39* 1.14* 0.85 0.85 0.84  CALCIUM 13.1* 11.3* 11.1* 10.8* 11.2*  PHOS  --   --   --   --  1.6*   Liver Function Tests:  Recent Labs Lab 11/01/14 1530 11/03/14 1619 11/04/14 0630 11/07/14 0710  AST 41* 40* 25  --   ALT 13 15 11   --   ALKPHOS 72 73 51  --   BILITOT 0.9 1.1 0.6  --   PROT 7.7 7.7 5.7*  --   ALBUMIN 3.8 3.8 2.7* 2.8*   CBC:  Recent Labs Lab 11/01/14 1530 11/03/14 2035 11/04/14 0630 11/05/14 1649 11/06/14 0742  WBC 6.7 14.6* 11.8* 4.9 5.6  NEUTROABS 5.1 13.3*  --   --   --   HGB 16.0* 13.5 11.9* 12.3 13.3  HCT 47.2* 40.3 36.1 37.9 40.6  MCV 80.8 79.8 79.7 79.6 79.6  PLT 245 273 273 288 268   Cardiac Enzymes:  Recent Labs Lab 11/01/14 1530  TROPONINI <0.03   Recent Results (from the past 240 hour(s))  Urine culture     Status: None   Collection Time: 11/01/14  3:09 PM  Result Value Ref Range Status   Culture NO GROWTH Performed at [facility]   Final   Report Status 11/03/2014 FINAL  Final  MRSA PCR Screening     Status: Abnormal   Collection Time: 11/04/14 12:55 AM  Result Value Ref Range Status   MRSA by PCR POSITIVE (A) NEGATIVE Final     Scheduled Meds: . aspirin EC  81 mg Oral Daily  . donepezil  5 mg Oral QHS  . enoxaparin  injection  30 mg Subcutaneous QHS   Continuous Infusions: . sodium chloride 75 mL/hr at 11/07/14 [phone]

## 2014-11-07 NOTE — Care Management Note (Signed)
CARE MANAGEMENT NOTE 11/07/2014  Patient:  [name], [name]   Account Number:  [id number]  Date Initiated:  11/07/2014  Documentation initiated by:  Leora Platt  Subjective/Objective Assessment:   CM following for progression and d/c planning.     Action/Plan:   Noted that pt lives with family, no HH services in place at this time.   Anticipated DC Date:  11/09/2014   Anticipated DC Plan:  [place]         Choice offered to / List presented to:             Status of service:   Medicare Important Message given?  YES (If response is "NO", the following Medicare IM given date fields will be blank) Date Medicare IM given:  11/07/2014 Medicare IM given by:  Damarkus Balis Date Additional Medicare IM given:   Additional Medicare IM given by:    Discharge Disposition:    Per UR Regulation:    If discussed at Long Length of Stay Meetings, dates discussed:    Comments:

## 2014-11-07 NOTE — Clinical Social Work Psychosocial (Signed)
Clinical Social Work Department BRIEF PSYCHOSOCIAL ASSESSMENT 11/07/2014  Patient:  DESEREA, BORDLEY     Account Number:  [id number]     Admit date:  11/03/2014  Clinical Social Worker:  Frederico Hamman  Date/Time:  11/07/2014 03:23 AM  Referred by:  Physician  Date Referred:  11/07/2014  Other Referral:   Interview type:  Family Other interview type:    PSYCHOSOCIAL DATA Living Status:  FAMILY Admitted from facility:   Level of care:   Primary support name:  [name] Higgiins Primary support relationship to patient:  CHILD, ADULT Degree of support available:   Both sons very involved in caring for patient. Annia Belt - [phone] and Gracy Racer [phone].    CURRENT CONCERNS Current Concerns  Post-Acute Placement   Other Concerns:    SOCIAL WORK ASSESSMENT / PLAN CSW talked with both sons by phone regarding discharge planning and PT/MD recommendation of ST rehab. Initially both son's explained to CSW about the 24/7 care patient receives at home (Son [name] lives with patient and assists with her care and paid help 5 days a week, 8 hours a day), however as conversation continued, they realized the benefit of having patient go to a facility for [place] rehab. CSW explained the facility search process and informed them that SNF list in patient room.   Assessment/plan status:  Psychosocial Support/Ongoing Assessment of Needs Other assessment/ plan:   Information/referral to community resources:   [facility] skilled facility list.    PATIENT'S/FAMILY'S RESPONSE TO PLAN OF CARE: Sons initially declined SNF because of the assistance patient receives at home, however as CSW explained the benefits of ST rehab, sons are in agreement for patient to discharge to a facility.       Nou Chard Givens, MSW, LCSW Licensed Clinical Social Worker [place] [phone]

## 2014-11-08 LAB — GLUCOSE, CAPILLARY
GLUCOSE-CAPILLARY: 73 mg/dL (ref 70–99)
Glucose-Capillary: 65 mg/dL — ABNORMAL LOW (ref 70–99)
Glucose-Capillary: 93 mg/dL (ref 70–99)
Glucose-Capillary: 71 mg/dL (ref 70–99)

## 2014-11-08 LAB — RENAL FUNCTION PANEL
ALBUMIN: 2.4 g/dL — AB (ref 3.5–5.2)
ANION GAP: 9 (ref 5–15)
BUN: 6 mg/dL (ref 6–23)
CALCIUM: 9.8 mg/dL (ref 8.4–10.5)
CHLORIDE: 106 meq/L (ref 96–112)
CO2: 23 mmol/L (ref 19–32)
Creatinine, Ser: 0.83 mg/dL (ref 0.50–1.10)
GFR calc Af Amer: 70 mL/min — ABNORMAL LOW (ref >90)
GFR calc non Af Amer: 60 mL/min — ABNORMAL LOW (ref >90)
Glucose, Bld: 75 mg/dL (ref 70–99)
Phosphorus: 2.3 mg/dL (ref 2.3–4.6)
Potassium: 4 mmol/L (ref 3.5–5.1)
Sodium: 138 mmol/L (ref 135–145)

## 2014-11-08 LAB — MAGNESIUM: MAGNESIUM: 1.6 mg/dL (ref 1.5–2.5)

## 2014-11-08 MED ORDER — ENSURE COMPLETE PO LIQD: 237.0000 mL | Freq: Three times a day (TID) | ORAL | Status: DC

## 2014-11-08 MED ORDER — MAGNESIUM OXIDE 400 (241.3 MG) MG PO TABS
200.0000 mg | ORAL_TABLET | Freq: Two times a day (BID) | ORAL | Status: DC
  Administered 2014-11-08: 200 mg via ORAL
  Filled 2014-11-08 (×2): qty 0.5

## 2014-11-08 MED ORDER — MAGNESIUM OXIDE 400 (241.3 MG) MG PO TABS: 200.0000 mg | ORAL_TABLET | Freq: Two times a day (BID) | ORAL | Status: DC

## 2014-11-08 MED ORDER — MUPIROCIN 2 % EX OINT: 1.0000 "application " | TOPICAL_OINTMENT | Freq: Two times a day (BID) | CUTANEOUS | Status: DC

## 2014-11-08 NOTE — Discharge Summary (Signed)
Physician Discharge Summary  [name] XTK:[id number] DOB: Jan 18, 1922 DOA: 11/03/2014  PCP: Kevan Ny, MD  Admit date: 11/03/2014 Discharge date: 11/08/2014  Time spent: 35 minutes  Recommendations for Outpatient Follow-up:  Repeat B-met to follow calcium level. Need Phosphorus and Mg  Level check in 2 days. Encourage oral intake.  Monitor BP, consider start BP medication other than HCTZ due to hypercalcemia  Discharge Diagnoses:    Hypercalcemia   Alzheimer's disease   Essential hypertension, malignant   Chronic kidney disease, stage II (mild)   Hypertension   Protein-calorie malnutrition, severe   Acute encephalopathy   Acute renal failure syndrome   Cough   Dehydration   Discharge Condition: Stable.   Diet recommendation: Regular diet. Dysphagia 2 diet.   Filed Weights   11/04/14 2130 11/05/14 2046 11/08/14 0500  Weight: 47.537 kg (104 lb 12.8 oz) 47.537 kg (104 lb 12.8 oz) 48.762 kg (107 lb 8 oz)    History of present illness:  [name] is a 81 y.o. female with Past medical history of Alzheimer's disease, hypertension, most 2 arthritis, chronic kidney disease recurrent hypercalcemia in the past, . Patient presents with complaints of lethargy and generalized body pain. Patient recently presented in ER on January 1 with the complaint of lethargy, left-sided weakness and slurred speech. She also had complaints of generalized body ache and abdominal pain with poor oral intake.  Workup was done in the ER and her symptoms were improved and she was at her baseline with negative CT scan and she was sent home with Plavix. After going home the patient continues to complain off generalized body ache with abdominal pain. She was more sleepy and drowsy. She needs to be fed at her baseline but since last few days has not been eating at all. There is no recent change in her medications reported. Patient went to see her PCP who recommended her to be sent to ER for  admission. No fall no trauma noted injury reported. No fever no chills no cough. No nausea no vomiting. No diarrhea. Patient did have an episode of incontinence of urine but no seizure-like activity no unresponsiveness.  patient has presented in the ER in the past with recurrent hypercalcemia which was thought to be secondary to calcium supplementation with hydrochlorothiazide and hypovolemia since extensive workup was unremarkable.  Hospital Course:   Acute encephalopathy, metabolic  -Secondary to dehydration, hypercalcemia, progressive FTT -Patient is awake and alert but only oriented to self and place. -Feels better today, PT/OT recommended skilled nursing.  Hypercalcemia -Appears to be responding to IVF, continue IV fluids.  -Hypercalcemia is likely secondary to right-sided breast cancer. -Calcium was trending down initially, today back at 11.2 and corrected as 12.2. -Received 1 dose of pamidronate, increase IV fluids and gave 1 dose of Lasix, . -Calcium has decrease to 9. Will discontinue HCTZ at discharge also.   Hypoglycemia; resolved. Encourage oral intake.   Breast cancer -Right-sided breast cancer diagnosed in June 2015. -Spoke with son [name]) family is aware about the diagnosis since June 2015. -Family chose the palliative route for cancer treatment because of the advanced age.  Alzheimer's disease - Continue donepezil  - PT/OT evaluation requested, SNF vs 24 hour supervision recommended   Essential hypertension - reasonable inpatient control. Holding HCTZ, triamterene due to hypercalcemia and dehydration.   Acute on Chronic kidney disease, stage II (mild) - pre renal component - Creatinine is back to normal with IV fluids.  Dehydration - based on physical exam  with very dry, hypercalcemia, hemoconcentration - stabilizing with IVF   Severe PCM - in the context of advancing dementia - nutritionist consulted   Leukocytosis - unclear etiology, UA with no  bacteria noted, urine culture pending  - WBC is trending down with no ABX on board   Procedures:  none  Consultations:  none  Discharge Exam: Filed Vitals:   11/08/14 0900  BP: 140/66  Pulse: 101  Temp: 97.7 F (36.5 C)  Resp: 17    General: Alert in no distress. oriented to person and place.  Cardiovascular: S 1, S 2 RRR Respiratory: CTA  Discharge Instructions   Discharge Instructions    Diet - low sodium heart healthy    Complete by:  As directed      Increase activity slowly    Complete by:  As directed           Current Discharge Medication List    START taking these medications   Details  feeding supplement, ENSURE COMPLETE, (ENSURE COMPLETE) LIQD Take 237 mLs by mouth 3 (three) times daily with meals. Qty: 30 Bottle, Refills: 0    magnesium oxide (MAG-OX) 400 (241.3 MG) MG tablet Take 0.5 tablets (200 mg total) by mouth 2 (two) times daily. Qty: 30 tablet, Refills: 0    mupirocin ointment (BACTROBAN) 2 % Place 1 application into the nose 2 (two) times daily. Qty: 22 g, Refills: 0      CONTINUE these medications which have NOT CHANGED   Details  acetaminophen (TYLENOL) 500 MG tablet Take 500 mg by mouth every 6 (six) hours as needed for mild pain.    aspirin EC 81 MG tablet Take 81 mg by mouth daily.    clopidogrel (PLAVIX) 75 MG tablet Take 1 tablet (75 mg total) by mouth daily with breakfast. Qty: 30 tablet, Refills: 2    donepezil (ARICEPT) 5 MG tablet Take 5 mg by mouth at bedtime.     Multiple Vitamin (MULTIVITAMIN) tablet Take 1 tablet by mouth daily.      nitroGLYCERIN (NITROSTAT) 0.4 MG SL tablet Place 0.4 mg under the tongue every 5 (five) minutes as needed for chest pain.     traMADol-acetaminophen (ULTRACET) 37.5-325 MG per tablet Take 1 tablet by mouth 2 (two) times daily as needed for moderate pain.      STOP taking these medications     Chlorpheniramine-DM (CORICIDIN COUGH/COLD) 4-30 MG TABS       triamterene-hydrochlorothiazide (MAXZIDE-25) 37.5-25 MG per tablet        Allergies  Allergen Reactions  . Cephalexin     unknown  . Penicillins     unknown   Follow-up Information    Follow up with [name], ERIC, MD In 1 week.   Specialty:  Internal Medicine   Contact information:   [facility] [place] [phone]        The results of significant diagnostics from this hospitalization (including imaging, microbiology, ancillary and laboratory) are listed below for reference.    Significant Diagnostic Studies: Ct Head Wo Contrast  11/03/2014   CLINICAL DATA:  Acute onset of encephalopathy.  Initial encounter.  EXAM: CT HEAD WITHOUT CONTRAST  TECHNIQUE: Contiguous axial images were obtained from the base of the skull through the vertex without intravenous contrast.  COMPARISON:  CT of the head performed 11/01/2014  FINDINGS: There is no evidence of acute infarction, mass lesion, or intra- or extra-axial hemorrhage on CT.  Prominence of the ventricles  and sulci reflects mild to moderate cortical volume loss. Mild cerebellar atrophy is noted. Scattered periventricular and subcortical white matter change likely reflects small vessel ischemic microangiopathy.  The brainstem and fourth ventricle are within normal limits. The basal ganglia are unremarkable in appearance. The cerebral hemispheres demonstrate grossly normal gray-white differentiation. No mass effect or midline shift is seen.  There is no evidence of fracture; hyperostosis interna is noted. The visualized portions of the orbits are within normal limits. The paranasal sinuses and mastoid air cells are well-aerated. No significant soft tissue abnormalities are seen.  IMPRESSION: 1. No acute intracranial pathology seen on CT. 2. Mild to moderate cortical volume loss and scattered small vessel ischemic microangiopathy.   Electronically Signed   By: Garald Balding M.D.   On: 11/03/2014  22:41   Ct Head Wo Contrast  11/01/2014   CLINICAL DATA:  Left-sided weakness and slurred speech. Initial encounter.  EXAM: CT HEAD WITHOUT CONTRAST  TECHNIQUE: Contiguous axial images were obtained from the base of the skull through the vertex without intravenous contrast.  COMPARISON:  04/12/2014.  FINDINGS: No mass lesion, mass effect, midline shift, hydrocephalus, hemorrhage. No acute territorial cortical ischemia/infarct. Atrophy and chronic ischemic white matter disease is present. Benign basal ganglia calcifications are present. Intracranial atherosclerosis. Hyperostosis of the skull. Visible paranasal sinuses appear normal.  IMPRESSION: Atrophy and chronic ischemic white matter disease without acute intracranial abnormality.   Electronically Signed   By: Dereck Ligas M.D.   On: 11/01/2014 16:14   Dg Chest Port 1 View  11/03/2014   CLINICAL DATA:  Cough.  EXAM: PORTABLE CHEST - 1 VIEW  COMPARISON:  November 01, 2014.  FINDINGS: The heart size and mediastinal contours are within normal limits. Both lungs are clear. No pneumothorax or pleural effusion is noted. The visualized skeletal structures are unremarkable.  IMPRESSION: No acute cardiopulmonary abnormality seen.   Electronically Signed   By: Sabino Dick M.D.   On: 11/03/2014 18:05   Dg Chest Port 1 View  11/01/2014   CLINICAL DATA:  Generalize weakness. Acute mental status changes. Current history of hypertension and stage 2 chronic kidney disease.  EXAM: PORTABLE CHEST - 1 VIEW  COMPARISON:  CT chest 6/13/1,015. Portable chest x-ray 04/11/2014. Two-view chest x-ray 08/13/2011.  FINDINGS: Cardiac silhouette upper normal in size to slightly enlarged, unchanged. Thoracic aorta mildly tortuous and atherosclerotic, unchanged. Hilar and mediastinal contours otherwise unremarkable. Lungs clear. Bronchovascular markings normal. Pulmonary vascularity normal. No visible pleural effusions. No pneumothorax. Calcification at the insertion of the right  supraspinatus tendon on the greater tuberosity of the right humerus.  IMPRESSION: 1.  No acute cardiopulmonary disease. 2. Stable borderline heart size. 3. Calcific supraspinatus tendinitis involving the right shoulder.   Electronically Signed   By: Evangeline Dakin M.D.   On: 11/01/2014 14:58    Microbiology: Recent Results (from the past 240 hour(s))  Urine culture     Status: None   Collection Time: 11/01/14  3:09 PM  Result Value Ref Range Status   Specimen Description URINE, CATHETERIZED  Final   Special Requests NONE  Final   Colony Count NO GROWTH Performed at [facility]   Final   Culture NO GROWTH Performed at [facility]   Final   Report Status 11/03/2014 FINAL  Final  Urine culture     Status: None   Collection Time: 11/03/14  8:01 PM  Result Value Ref Range Status   Specimen Description URINE, RANDOM  Final   Special  Requests NONE  Final   Colony Count NO GROWTH Performed at [facility]   Final   Culture NO GROWTH Performed at [facility]   Final   Report Status 11/04/2014 FINAL  Final  MRSA PCR Screening     Status: Abnormal   Collection Time: 11/04/14 12:55 AM  Result Value Ref Range Status   MRSA by PCR POSITIVE (A) NEGATIVE Final    Comment:        The GeneXpert MRSA Assay (FDA approved for NASAL specimens only), is one component of a comprehensive MRSA colonization surveillance program. It is not intended to diagnose MRSA infection nor to guide or monitor treatment for MRSA infections. RESULT CALLED TO, READ BACK BY AND VERIFIED WITH: MOORE,K RN [id number] 11/04/14 MITCHELL,L      Labs: Basic Metabolic Panel:  Recent Labs Lab 11/04/14 0630 11/05/14 1649 11/06/14 0742 11/07/14 0710 11/08/14 0655  NA 138 140 141 141 138  K 3.9 3.3* 3.3* 4.2 4.0  CL 107 108 111 113* 106  CO2 _0 GLUCOSE 81 96 73 75 75  BUN _1 CREATININE 1.14* 0.85 0.85 0.84 0.83  CALCIUM 11.3* 11.1* 10.8* 11.2* 9.8  MG   --   --   --   --  1.6  PHOS  --   --   --  1.6* 2.3   Liver Function Tests:  Recent Labs Lab 11/01/14 1530 11/03/14 1619 11/04/14 0630 11/07/14 0710 11/08/14 0655  AST 41* 40* 25  --   --   ALT _2 --   --   ALKPHOS 72 73 51  --   --   BILITOT 0.9 1.1 0.6  --   --   PROT 7.7 7.7 5.7*  --   --   ALBUMIN 3.8 3.8 2.7* 2.8* 2.4*   No results for input(s): LIPASE, AMYLASE in the last 168 hours. No results for input(s): AMMONIA in the last 168 hours. CBC:  Recent Labs Lab 11/01/14 1530 11/03/14 2035 11/04/14 0630 11/05/14 1649 11/06/14 0742  WBC 6.7 14.6* 11.8* 4.9 5.6  NEUTROABS 5.1 13.3*  --   --   --   HGB 16.0* 13.5 11.9* 12.3 13.3  HCT 47.2* 40.3 36.1 37.9 40.6  MCV 80.8 79.8 79.7 79.6 79.6  PLT 245 273 273 288 268   Cardiac Enzymes:  Recent Labs Lab 11/01/14 1530  TROPONINI <0.03   BNP: BNP (last 3 results) No results for input(s): PROBNP in the last 8760 hours. CBG:  Recent Labs Lab 11/05/14 1634 11/05/14 2051 11/07/14 1704 11/07/14 2106 11/08/14 0810  GLUCAP 88 97 97 94 65*       Signed:  Regalado, Belkys A  [facility] 11/08/2014, 9:57 AM

## 2014-11-08 NOTE — Clinical Social Work Placement (Addendum)
Clinical Social Work Department CLINICAL SOCIAL WORK PLACEMENT NOTE 11/08/2014  Patient:  [name], [name]  Account Number:  [id number] [place] date:  11/03/2014  Clinical Social Worker:  Aliyanna Wassmer Givens, LCSW  Date/time:  11/08/2014 10:42 AM  Clinical Social Work is seeking post-discharge placement for this patient at the following level of care:   [place]   (*CSW will update this form in Epic as items are completed)   11/07/2014  Patient/family provided with [place] Department of Clinical Social Work's list of facilities offering this level of care within the geographic area requested by the patient (or if unable, by the patient's family).  11/07/2014  Patient/family informed of their freedom to choose among providers that offer the needed level of care, that participate in Medicare, Medicaid or managed care program needed by the patient, have an available bed and are willing to accept the patient.    Patient/family informed of [facility]' ownership interest in [facility], as well as of the fact that they are under no obligation to receive care at this facility.  PASARR submitted to EDS on 11/07/2014 PASARR number received on 11/07/2014  FL2 transmitted to all facilities in geographic area requested by pt/family on  11/07/2014 FL2 transmitted to all facilities within larger geographic area on   Patient informed that his/her managed care company has contracts with or will negotiate with  certain facilities, including the following:     Patient/family informed of bed offers received:  11/08/2014 Patient chooses bed at [facility] Physician recommends and patient chooses bed at    Patient to be transferred to [facility] on  11/08/2014 Patient to be transferred to facility by ambulance Patient and family notified of transfer on 11/08/2014 Name of family member notified:  Gracy Racer [phone])  The following  physician request were entered in Epic:  Additional Comments:    Davier Tramell Givens, MSW, [place] Licensed Clinical Social Worker Clinical Social Work [place] [phone]

## 2014-11-08 NOTE — Progress Notes (Signed)
Physical Therapy Treatment Patient Details Name: [name] MRN: [id number] DOB: 1922-05-28 Today's Date: 11/08/2014    History of Present Illness Pt is a 81 y.o. female with PMH of Alzheimer's disease, hypertension, arthritis, CKD, recurrent hypercalcemia in the past. Patient presents with complaints of lethargy and generalized body pain. Patient recently presented in ER on January 1 with the complaint of lethargy, left-sided weakness and slurred speech. She also had complaints of generalized body ache and abdominal pain with poor oral intake. Workup was done in the ER and her symptoms were improved and she was at her baseline with negative CT scan and she was sent home. After going home the patient continues to complain of generalized body ache with abdominal pain. She needs to be fed at her baseline but since last few days has not been eating at all.    PT Comments    Pt progressing towards physical therapy goals. Pt was able to perform transfers with +2 assist. Unable to take steps with the RW for support - pt limited by abdominal pain at this time. RN notified. Will continue to follow and progress as able per POC.   Follow Up Recommendations  SNF;Supervision/Assistance - 24 hour     Equipment Recommendations  Rolling walker with 5" wheels    Recommendations for Other Services       Precautions / Restrictions Precautions Precautions: Fall Restrictions Weight Bearing Restrictions: No    Mobility  Bed Mobility Overal bed mobility: Needs Assistance Bed Mobility: Rolling;Sidelying to Sit Rolling: Min assist Sidelying to sit: Mod assist       General bed mobility comments: Bed pad use for bed mobility. Pt appeared to have a difficult time sequencing this session and required increased VC's to get to EOB. Abdominal pain limiting active movement to EOB.   Transfers Overall transfer level: Needs assistance Equipment used: 2 person hand held assist;Rolling walker (2  wheeled) Transfers: Sit to/from [facility] Sit to Stand: Mod assist;+2 physical assistance Stand pivot transfers: Mod assist;+2 physical assistance       General transfer comment: Pt was able to power-up to full standing with the RW initially. Unable to take steps so walker was removed and +2 assist was provided for SPT to chair.   Ambulation/Gait             General Gait Details: Unable at this time. Pt took one small step forward however was not able to lean anteriorly enough to get weight over feet.    Stairs            Wheelchair Mobility    Modified Rankin (Stroke Patients Only)       Balance Overall balance assessment: Needs assistance Sitting-balance support: Feet supported;No upper extremity supported Sitting balance-Leahy Scale: Fair     Standing balance support: Bilateral upper extremity supported;During functional activity Standing balance-Leahy Scale: Poor                      Cognition Arousal/Alertness: Lethargic Behavior During Therapy: Flat affect Overall Cognitive Status: No family/caregiver present to determine baseline cognitive functioning                      Exercises      General Comments        Pertinent Vitals/Pain Pain Assessment: No/denies pain    Home Living  Prior Function            PT Goals (current goals can now be found in the care plan section) Acute Rehab PT Goals Patient Stated Goal: Pt did not state goals at this time.  PT Goal Formulation: With patient Time For Goal Achievement: 11/18/14 Potential to Achieve Goals: Fair Progress towards PT goals: Progressing toward goals    Frequency  Min 2X/week    PT Plan Current plan remains appropriate    Co-evaluation             End of Session Equipment Utilized During Treatment: Gait belt Activity Tolerance: Patient limited by fatigue;Patient limited by pain Patient left: in chair;with  chair alarm set;with call bell/phone within reach     Time: 1041-1103 PT Time Calculation (min) (ACUTE ONLY): 22 min  Charges:  $Therapeutic Activity: 8-22 mins                    G Codes:      Rolinda Roan Nov 25, 2014, 11:17 AM   Rolinda Roan, PT, DPT Acute Rehabilitation Services Pager: [phone]

## 2014-11-11 LAB — GLUCOSE, CAPILLARY
GLUCOSE-CAPILLARY: 72 mg/dL (ref 70–99)
GLUCOSE-CAPILLARY: 78 mg/dL (ref 70–99)
Glucose-Capillary: 81 mg/dL (ref 70–99)

## 2015-01-30 ENCOUNTER — Emergency Department (HOSPITAL_COMMUNITY)
Admission: EM | Admit: 2015-01-30 | Discharge: 2015-01-30 | Disposition: A | Discharge status: Home / Self Care | Payer: Medicare Other | Attending: Emergency Medicine | Admitting: Emergency Medicine

## 2015-01-30 ENCOUNTER — Encounter (HOSPITAL_COMMUNITY): Payer: Self-pay | Admitting: *Deleted

## 2015-01-30 DIAGNOSIS — R5383 Other fatigue: Secondary | ICD-10-CM | POA: Diagnosis not present

## 2015-01-30 DIAGNOSIS — R630 Anorexia: Secondary | ICD-10-CM | POA: Insufficient documentation

## 2015-01-30 DIAGNOSIS — Z79899 Other long term (current) drug therapy: Secondary | ICD-10-CM | POA: Diagnosis not present

## 2015-01-30 DIAGNOSIS — Z880 Allergy status to penicillin: Secondary | ICD-10-CM | POA: Diagnosis not present

## 2015-01-30 DIAGNOSIS — I129 Hypertensive chronic kidney disease with stage 1 through stage 4 chronic kidney disease, or unspecified chronic kidney disease: Secondary | ICD-10-CM | POA: Insufficient documentation

## 2015-01-30 DIAGNOSIS — N182 Chronic kidney disease, stage 2 (mild): Secondary | ICD-10-CM | POA: Diagnosis not present

## 2015-01-30 DIAGNOSIS — Z859 Personal history of malignant neoplasm, unspecified: Secondary | ICD-10-CM | POA: Diagnosis not present

## 2015-01-30 DIAGNOSIS — R195 Other fecal abnormalities: Secondary | ICD-10-CM | POA: Diagnosis not present

## 2015-01-30 DIAGNOSIS — Z8673 Personal history of transient ischemic attack (TIA), and cerebral infarction without residual deficits: Secondary | ICD-10-CM | POA: Diagnosis not present

## 2015-01-30 DIAGNOSIS — Z8739 Personal history of other diseases of the musculoskeletal system and connective tissue: Secondary | ICD-10-CM | POA: Insufficient documentation

## 2015-01-30 DIAGNOSIS — Z87891 Personal history of nicotine dependence: Secondary | ICD-10-CM | POA: Insufficient documentation

## 2015-01-30 DIAGNOSIS — R7989 Other specified abnormal findings of blood chemistry: Secondary | ICD-10-CM | POA: Diagnosis not present

## 2015-01-30 DIAGNOSIS — Z792 Long term (current) use of antibiotics: Secondary | ICD-10-CM | POA: Diagnosis not present

## 2015-01-30 DIAGNOSIS — G309 Alzheimer's disease, unspecified: Secondary | ICD-10-CM | POA: Insufficient documentation

## 2015-01-30 DIAGNOSIS — N63 Unspecified lump in breast: Secondary | ICD-10-CM | POA: Insufficient documentation

## 2015-01-30 DIAGNOSIS — Z87440 Personal history of urinary (tract) infections: Secondary | ICD-10-CM | POA: Insufficient documentation

## 2015-01-30 DIAGNOSIS — R011 Cardiac murmur, unspecified: Secondary | ICD-10-CM | POA: Insufficient documentation

## 2015-01-30 DIAGNOSIS — Z7982 Long term (current) use of aspirin: Secondary | ICD-10-CM | POA: Insufficient documentation

## 2015-01-30 HISTORY — DX: Malignant (primary) neoplasm, unspecified: C80.1

## 2015-01-30 HISTORY — DX: Cardiac murmur, unspecified: R01.1

## 2015-01-30 LAB — COMPREHENSIVE METABOLIC PANEL
ALT: 15 U/L (ref 0–35)
AST: 33 U/L (ref 0–37)
Albumin: 3.4 g/dL — ABNORMAL LOW (ref 3.5–5.2)
Alkaline Phosphatase: 54 U/L (ref 39–117)
Anion gap: 11 (ref 5–15)
BUN: 11 mg/dL (ref 6–23)
CALCIUM: 12.1 mg/dL — AB (ref 8.4–10.5)
CO2: 25 mmol/L (ref 19–32)
Chloride: 103 mmol/L (ref 96–112)
Creatinine, Ser: 1.15 mg/dL — ABNORMAL HIGH (ref 0.50–1.10)
GFR calc non Af Amer: 41 mL/min — ABNORMAL LOW (ref >90)
GFR, EST AFRICAN AMERICAN: 47 mL/min — AB (ref >90)
GLUCOSE: 97 mg/dL (ref 70–99)
Potassium: 3.8 mmol/L (ref 3.5–5.1)
SODIUM: 139 mmol/L (ref 135–145)
Total Bilirubin: 0.7 mg/dL (ref 0.3–1.2)
Total Protein: 6.7 g/dL (ref 6.0–8.3)

## 2015-01-30 LAB — CBC WITH DIFFERENTIAL/PLATELET
Basophils Absolute: 0 10*3/uL (ref 0.0–0.1)
Basophils Relative: 1 % (ref 0–1)
EOS PCT: 1 % (ref 0–5)
Eosinophils Absolute: 0 10*3/uL (ref 0.0–0.7)
HEMATOCRIT: 44.4 % (ref 36.0–46.0)
HEMOGLOBIN: 14.7 g/dL (ref 12.0–15.0)
Lymphocytes Relative: 30 % (ref 12–46)
Lymphs Abs: 1.5 10*3/uL (ref 0.7–4.0)
MCH: 26.9 pg (ref 26.0–34.0)
MCHC: 33.1 g/dL (ref 30.0–36.0)
MCV: 81.3 fL (ref 78.0–100.0)
MONO ABS: 0.3 10*3/uL (ref 0.1–1.0)
Monocytes Relative: 7 % (ref 3–12)
Neutro Abs: 3.2 10*3/uL (ref 1.7–7.7)
Neutrophils Relative %: 63 % (ref 43–77)
Platelets: 230 10*3/uL (ref 150–400)
RBC: 5.46 MIL/uL — ABNORMAL HIGH (ref 3.87–5.11)
RDW: 14.1 % (ref 11.5–15.5)
WBC: 5.1 10*3/uL (ref 4.0–10.5)

## 2015-01-30 LAB — TYPE AND SCREEN
ABO/RH(D): B POS
ANTIBODY SCREEN: NEGATIVE

## 2015-01-30 LAB — ABO/RH: ABO/RH(D): B POS

## 2015-01-30 LAB — PROTIME-INR
INR: 0.99 (ref 0.00–1.49)
PROTHROMBIN TIME: 13.2 s (ref 11.6–15.2)

## 2015-01-30 LAB — POC OCCULT BLOOD, ED
FECAL OCCULT BLD: NEGATIVE
FECAL OCCULT BLD: NEGATIVE

## 2015-01-30 LAB — APTT: aPTT: 32 seconds (ref 24–37)

## 2015-01-30 MED ORDER — SODIUM CHLORIDE 0.9 % IV SOLN
INTRAVENOUS | Status: DC
  Administered 2015-01-30: 15:00:00 via INTRAVENOUS

## 2015-01-30 NOTE — ED Provider Notes (Signed)
81 year old female, history of untreated cancer, history of dementia, lives with family members. Presents with dark colored stools, when family member was changing the patient's undergarments they noticed a large amount of dark colored stool which she thought might be blood because it was darker than normal, it was not black, it did not contain red blood. The patient has not been complaining of any other problems recently, she is on doxycycline for a early abscess at the top of the gluteal cleft and on exam has minimal tenderness to that area, there is no fluctuance, no induration, no obvious large abscess. The perianal and perirectal area shows no abscess, there is some brown stool around that area, her abdomen is minimally tender diffusely, the patient cringes and withdraws from pain that matter where you touch her even to light touch, the family member state that this is normal and associated with her dementia. Heart and lungs normal, no murmurs, no tachycardia, labs pending.  Medical screening examination/treatment/procedure(s) were conducted as a shared visit with non-physician practitioner(s) and myself.  I personally evaluated the patient during the encounter.  Clinical Impression:   Final diagnoses:  Dark stools  Hypercalcemia         Noemi Chapel, MD 01/30/15 [phone]

## 2015-01-30 NOTE — ED Provider Notes (Signed)
CSN: [id number]     Arrival date & time 01/30/15  1048 History   First MD Initiated Contact with Patient 01/30/15 1109     Chief Complaint  Patient presents with  . Blood In Stools     (Consider location/radiation/quality/duration/timing/severity/associated sxs/prior Treatment) The history is provided by the patient and medical records.    This is a 81 y.o. F with PMH significant for prior stroke, CKD, Alzheimer's disease, hypertension, current right breast cancer not undergoing treatment, presenting to the ED for possible blood in her stools.  Per family, home health aide was changing her brief earlier today and noticed a large amount of dark stool present-- states appeared darker than normal and she was concerned for blood. Patient has also had loose bowel movements recently but not frank diarrhea. No reported fever, chills, or sweats. No abdominal pain. No recent travel.  Has been on abx for abscess on her back.  No chest pain or shortness of breath. Patient states she does feel somewhat generally weak and fatigued. Family admits to decreased PO intake recently.  Patient is on daily aspirin and Plavix. No prior history of significant GI bleeding in the past.  Family states she has had colonoscopies in the past, unsure of results.  VSS.  Past Medical History  Diagnosis Date  . Urinary tract infection, site not specified   . Hematuria, unspecified   . Dysuria   . Pain in joint, site unspecified   . Alzheimer's disease   . Essential hypertension, malignant   . Osteoarthrosis, unspecified whether generalized or localized, unspecified site   . Chronic kidney disease, stage II (mild)   . Unspecified vitamin D deficiency   . Hypertension   . Stroke   . Cancer   . Heart murmur    History reviewed. No pertinent past surgical history. Family History  Problem Relation Age of Onset  . Hypertension Mother   . Hypertension Father    History  Substance Use Topics  . Smoking status: Former  Research scientist (life sciences)  . Smokeless tobacco: Not on file  . Alcohol Use: No   OB History    No data available     Review of Systems  Constitutional: Positive for fatigue.  Gastrointestinal: Positive for blood in stool.  All other systems reviewed and are negative.     Allergies  Cephalexin and Penicillins  Home Medications   Prior to Admission medications   Medication Sig Start Date End Date Taking? Authorizing Provider  acetaminophen (TYLENOL) 500 MG tablet Take 500 mg by mouth every 6 (six) hours as needed for mild pain.    Historical Provider, MD  aspirin EC 81 MG tablet Take 81 mg by mouth daily.    Historical Provider, MD  clopidogrel (PLAVIX) 75 MG tablet Take 1 tablet (75 mg total) by mouth daily with breakfast. 11/01/14   Daleen Bo, MD  donepezil (ARICEPT) 5 MG tablet Take 5 mg by mouth at bedtime.     Historical Provider, MD  feeding supplement, ENSURE COMPLETE, (ENSURE COMPLETE) LIQD Take 237 mLs by mouth 3 (three) times daily with meals. 11/08/14   Belkys A Regalado, MD  magnesium oxide (MAG-OX) 400 (241.3 MG) MG tablet Take 0.5 tablets (200 mg total) by mouth 2 (two) times daily. 11/08/14   Belkys A Regalado, MD  Multiple Vitamin (MULTIVITAMIN) tablet Take 1 tablet by mouth daily.      Historical Provider, MD  mupirocin ointment (BACTROBAN) 2 % Place 1 application into the nose 2 (two) times daily.  11/08/14   Belkys A Regalado, MD  nitroGLYCERIN (NITROSTAT) 0.4 MG SL tablet Place 0.4 mg under the tongue every 5 (five) minutes as needed for chest pain.     Historical Provider, MD  traMADol-acetaminophen (ULTRACET) 37.5-325 MG per tablet Take 1 tablet by mouth 2 (two) times daily as needed for moderate pain.    Historical Provider, MD   BP 122/82 mmHg  Pulse 81  Temp(Src) 96.5 F (35.8 C) (Axillary)  Resp 18  Ht 5\' 6"  (1.676 m)  Wt 109 lb (49.442 kg)  BMI 17.60 kg/m2  SpO2 100%   Physical Exam  Constitutional: She is oriented to person, place, and time. She appears well-developed  and well-nourished.  Thin, cachectic  HENT:  Head: Normocephalic and atraumatic.  Mouth/Throat: Oropharynx is clear and moist.  Eyes: Conjunctivae and EOM are normal. Pupils are equal, round, and reactive to light.  Neck: Normal range of motion. Neck supple.  Cardiovascular: Normal rate, regular rhythm and normal heart sounds.   Pulmonary/Chest: Effort normal and breath sounds normal. No respiratory distress. She has no wheezes.  Mass noted on right breast, non-tender  Abdominal: Soft. Bowel sounds are normal. There is no tenderness. There is no guarding.  Genitourinary: Rectum normal.  Developing abscess atop gluteal cleft, no active drainage or signs of cellulitis; no bleeding noted  Musculoskeletal: Normal range of motion.  Neurological: She is alert and oriented to person, place, and time.  Skin: Skin is warm and dry.  Psychiatric: She has a normal mood and affect.  Nursing note and vitals reviewed.   ED Course  Procedures (including critical care time) Labs Review Labs Reviewed  CBC WITH DIFFERENTIAL/PLATELET - Abnormal; Notable for the following:    RBC 5.46 (*)    All other components within normal limits  COMPREHENSIVE METABOLIC PANEL - Abnormal; Notable for the following:    Creatinine, Ser 1.15 (*)    Calcium 12.1 (*)    Albumin 3.4 (*)    GFR calc non Af Amer 41 (*)    GFR calc Af Amer 47 (*)    All other components within normal limits  PROTIME-INR  APTT  URINALYSIS, ROUTINE W REFLEX MICROSCOPIC  POC OCCULT BLOOD, ED  TYPE AND SCREEN  ABO/RH    Imaging Review No results found.   EKG Interpretation None      MDM   Final diagnoses:  Dark stools  Hypercalcemia   81 year old female with dark stools discovered by home health aide this morning. Patient has no prior history of GI bleeding in the past. She is on daily aspirin and Plavix. On exam, patient with developing abscess at top of her gluteal cleft. She has no gross blood on rectal exam.  Abdominal  exam is benign.  H/H stable.  Patient does have some mild elevation of her creatinine when compared with most recent values as well as some hypercalcemia which was addressed at her prior admission. Suspect there is a component of dehydration contributing to these factors. Patient without evidence of acute GI bleed at this time. She is currently on antibiotics for her abscess, will continue these at home. Patient was given IV hydration in the ED, will d/c home with close PCP FU.  Discussed plan with patient, he/she acknowledged understanding and agreed with plan of care.  Return precautions given for new or worsening symptoms.  Case discussed with attending physician, Dr. Sabra Heck, who evaluated patient and agrees with assessment and plan of care.  Larene Pickett, PA-C 01/30/15 [phone]  Noemi Chapel, MD 01/30/15 [phone]

## 2015-01-30 NOTE — Discharge Instructions (Signed)
Continue doxycycline as directed. Make sure to drink plenty of fluids to stay hydrated. Monitor stools for blood. Follow-up with your primary care physician. Return to the ED for new concerns.

## 2015-01-30 NOTE — ED Notes (Signed)
Pt's assistant was changing her diaper and noticed a large amount of dark blood in her stool.  Per son, pt has had very runny stools for several weeks.

## 2015-04-02 DEATH — deceased

## 2015-10-31 IMAGING — CT CT HEAD W/O CM
1 series · 15 of 29 positions shown, 19 images · non-contrast
Comparison: CT head 06/11/2011, 04/20/2011.  MRI brain 02/21/2006.

CLINICAL DATA: Code stroke.

EXAM:
CT HEAD WITHOUT CONTRAST
TECHNIQUE: Contiguous axial images were obtained from the base of the skull
through the vertex without intravenous contrast.

[Series 2: head 5.0 h30s · axial · 0.42mm/px · z∈[-170,-40]mm · 15 of 29 slices shown, 19 images]
[im 2/29  brain]
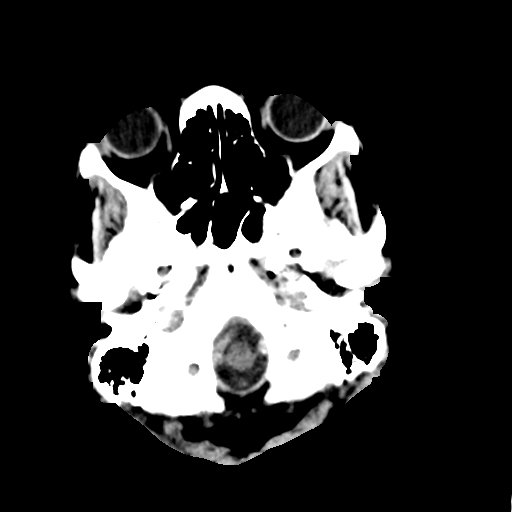
[im 2/29  bone]
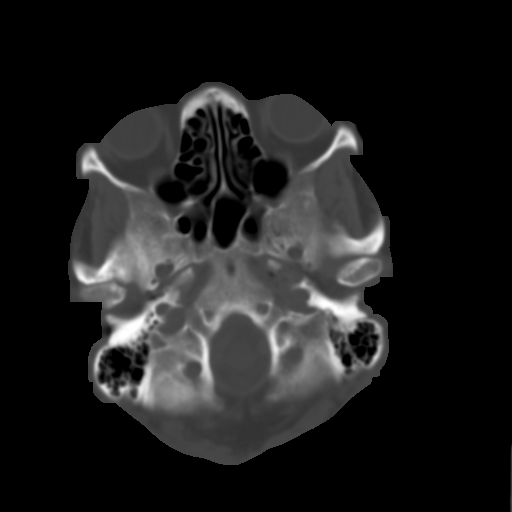
[im 4/29  brain]
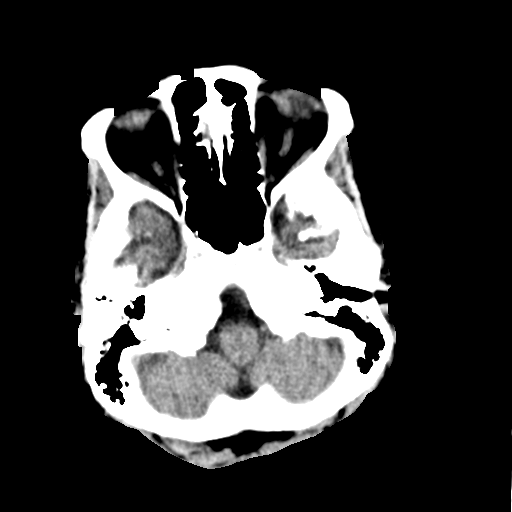
[im 6/29  brain]
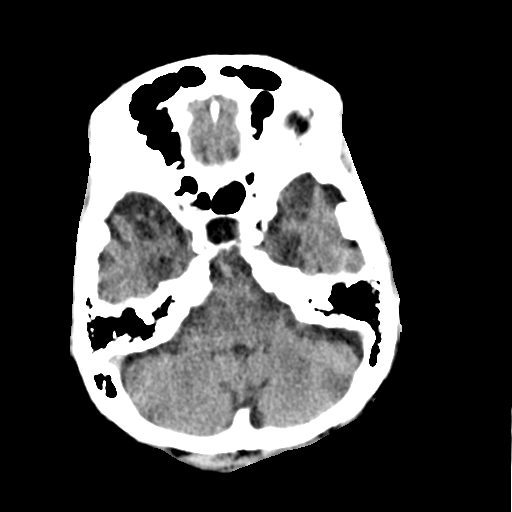
[im 8/29  brain]
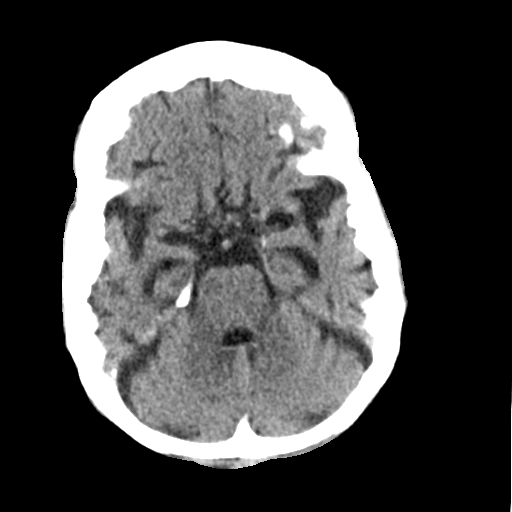
[im 10/29  brain]
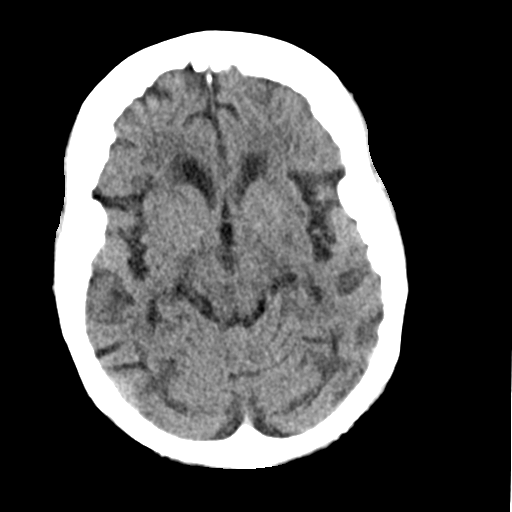
[im 10/29  bone]
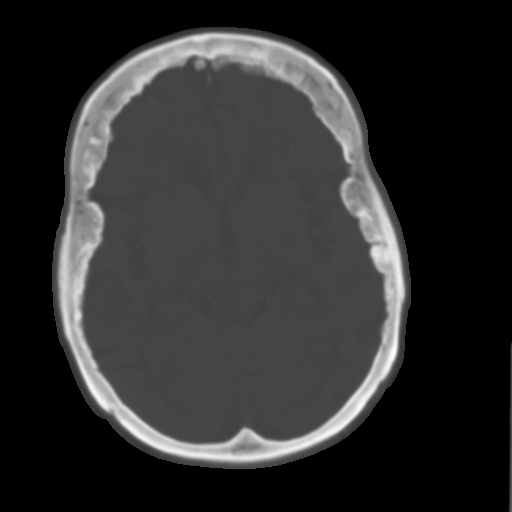
[im 11/29  brain]
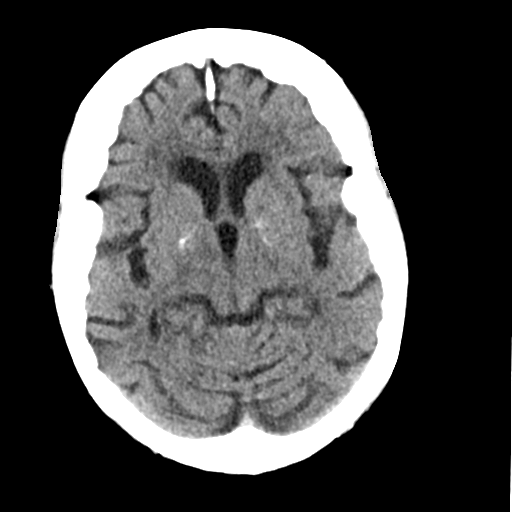
[im 13/29  brain]
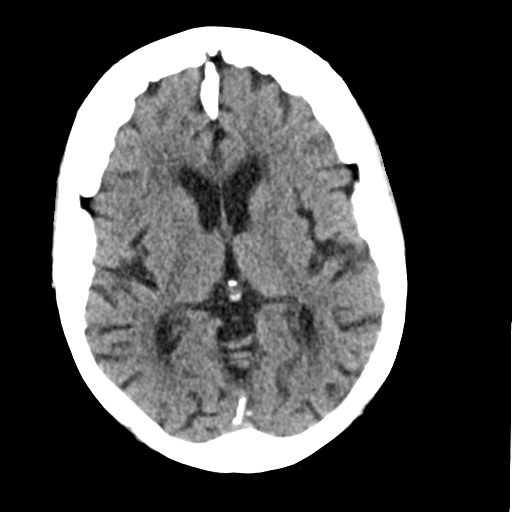
[im 15/29  brain]
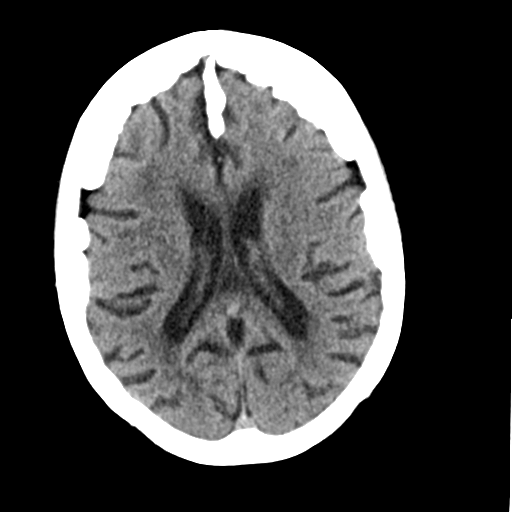
[im 17/29  brain]
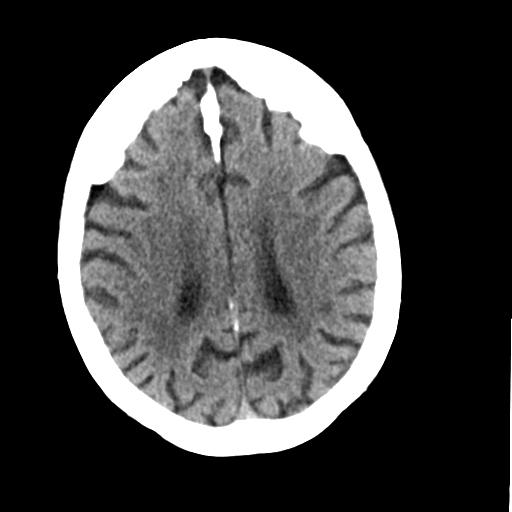
[im 17/29  bone]
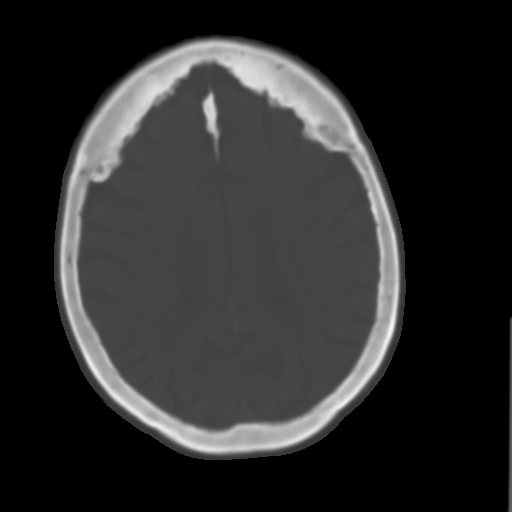
[im 19/29  brain]
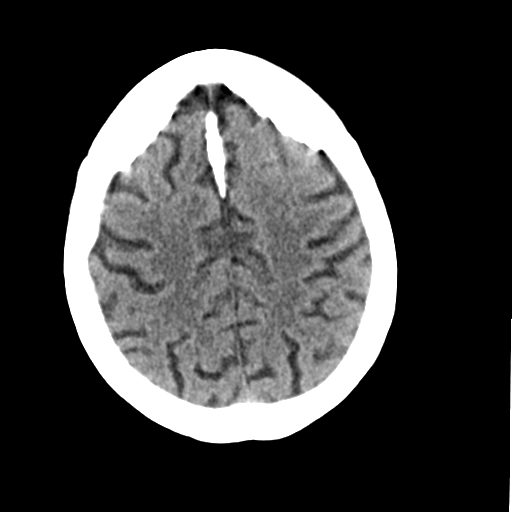
[im 20/29  brain]
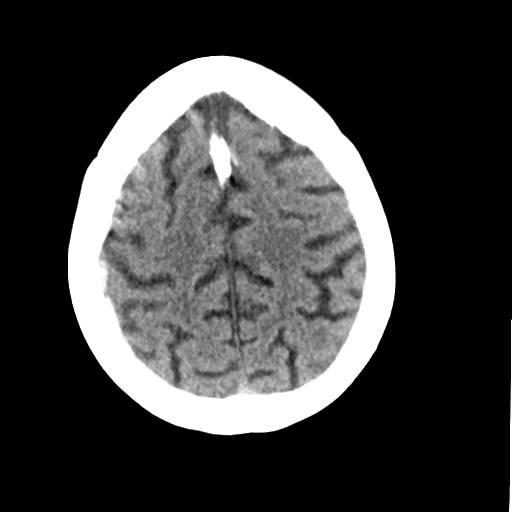
[im 22/29  brain]
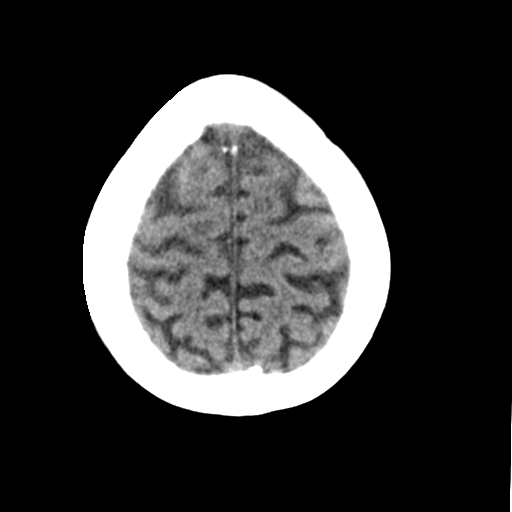
[im 24/29  brain]
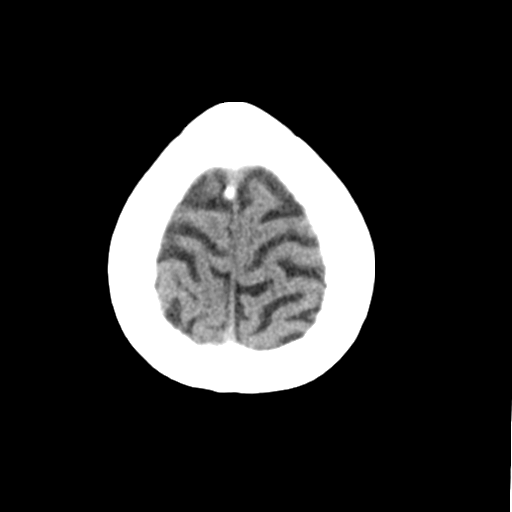
[im 24/29  bone]
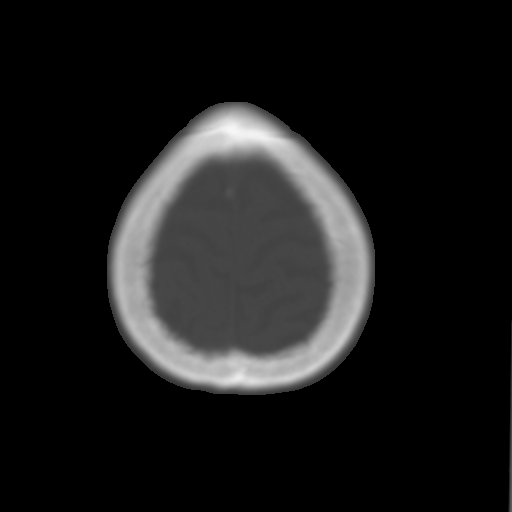
[im 26/29  brain]
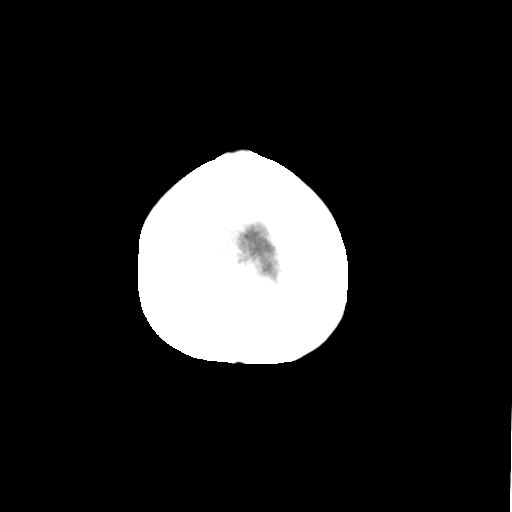
[im 28/29  brain]
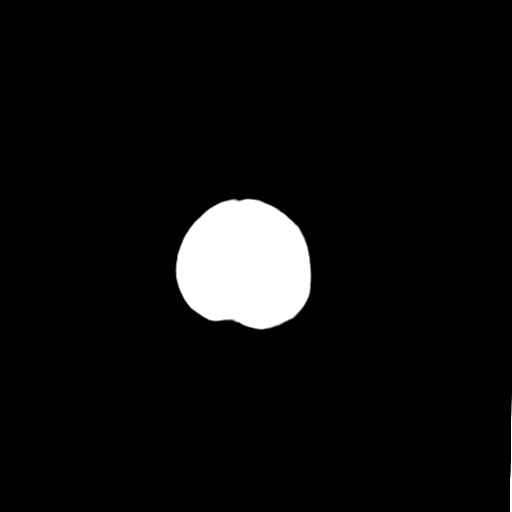

[15 of 29 positions shown; findings below may reference images not displayed]

FINDINGS: Moderate age related cortical, deep, and cerebellar atrophy,
unchanged since 6836 but progressive since 1223. Mild changes of
small vessel disease of the white matter, unchanged since 1223.
Physiologic calcifications in the basal ganglia, unchanged. No mass
lesion. No midline shift. No acute hemorrhage or hematoma. No
extra-axial fluid collections. No evidence of acute infarction.

Hyperostosis interna involving the frontal and temporal bones
bilaterally. Ossification involving anterior falx. Visualized
paranasal sinuses, bilateral mastoid air cells, and bilateral middle
ear cavities well-aerated. Bilateral carotid siphon and vertebral
artery atherosclerosis.
IMPRESSION: 1. No acute intracranial abnormality.
2. Moderate generalized atrophy and mild chronic microvascular
ischemic changes of the white matter.
